# Patient Record
Sex: Male | Born: 1945 | Race: Black or African American | Hispanic: No | Marital: Single | Smoking: Current every day smoker
Health system: Southern US, Community
[De-identification: ages and names within clinical notes are randomized; demographics above are authoritative.]

## PROBLEM LIST (undated history)

## (undated) DIAGNOSIS — F1410 Cocaine abuse, uncomplicated: Secondary | ICD-10-CM

## (undated) DIAGNOSIS — I10 Essential (primary) hypertension: Secondary | ICD-10-CM

## (undated) DIAGNOSIS — F1010 Alcohol abuse, uncomplicated: Secondary | ICD-10-CM

## (undated) DIAGNOSIS — F0390 Unspecified dementia without behavioral disturbance: Secondary | ICD-10-CM

## (undated) DIAGNOSIS — D332 Benign neoplasm of brain, unspecified: Secondary | ICD-10-CM

## (undated) DIAGNOSIS — B1920 Unspecified viral hepatitis C without hepatic coma: Secondary | ICD-10-CM

---

## 2018-01-21 ENCOUNTER — Inpatient Hospital Stay (HOSPITAL_COMMUNITY)
Admission: EM | Admit: 2018-01-21 | Discharge: 2018-01-29 | DRG: 638 | Disposition: A | Discharge status: Other Acute Inpatient Hospital | Payer: Medicare Other | Attending: Family Medicine | Admitting: Family Medicine

## 2018-01-21 ENCOUNTER — Other Ambulatory Visit: Payer: Self-pay

## 2018-01-21 DIAGNOSIS — D696 Thrombocytopenia, unspecified: Secondary | ICD-10-CM | POA: Diagnosis present

## 2018-01-21 DIAGNOSIS — E854 Organ-limited amyloidosis: Secondary | ICD-10-CM | POA: Diagnosis present

## 2018-01-21 DIAGNOSIS — Z781 Physical restraint status: Secondary | ICD-10-CM

## 2018-01-21 DIAGNOSIS — F17210 Nicotine dependence, cigarettes, uncomplicated: Secondary | ICD-10-CM | POA: Diagnosis present

## 2018-01-21 DIAGNOSIS — R402363 Coma scale, best motor response, obeys commands, at hospital admission: Secondary | ICD-10-CM | POA: Diagnosis present

## 2018-01-21 DIAGNOSIS — E1165 Type 2 diabetes mellitus with hyperglycemia: Secondary | ICD-10-CM | POA: Diagnosis present

## 2018-01-21 DIAGNOSIS — T380X5A Adverse effect of glucocorticoids and synthetic analogues, initial encounter: Secondary | ICD-10-CM | POA: Diagnosis present

## 2018-01-21 DIAGNOSIS — N3000 Acute cystitis without hematuria: Secondary | ICD-10-CM | POA: Diagnosis present

## 2018-01-21 DIAGNOSIS — Z794 Long term (current) use of insulin: Secondary | ICD-10-CM

## 2018-01-21 DIAGNOSIS — N179 Acute kidney failure, unspecified: Secondary | ICD-10-CM | POA: Diagnosis present

## 2018-01-21 DIAGNOSIS — G9340 Encephalopathy, unspecified: Secondary | ICD-10-CM | POA: Diagnosis present

## 2018-01-21 DIAGNOSIS — F0391 Unspecified dementia with behavioral disturbance: Secondary | ICD-10-CM | POA: Diagnosis present

## 2018-01-21 DIAGNOSIS — N289 Disorder of kidney and ureter, unspecified: Secondary | ICD-10-CM

## 2018-01-21 DIAGNOSIS — B961 Klebsiella pneumoniae [K. pneumoniae] as the cause of diseases classified elsewhere: Secondary | ICD-10-CM | POA: Diagnosis present

## 2018-01-21 DIAGNOSIS — R739 Hyperglycemia, unspecified: Secondary | ICD-10-CM | POA: Diagnosis not present

## 2018-01-21 DIAGNOSIS — R451 Restlessness and agitation: Secondary | ICD-10-CM | POA: Diagnosis present

## 2018-01-21 DIAGNOSIS — Z79899 Other long term (current) drug therapy: Secondary | ICD-10-CM

## 2018-01-21 DIAGNOSIS — E785 Hyperlipidemia, unspecified: Secondary | ICD-10-CM | POA: Diagnosis present

## 2018-01-21 DIAGNOSIS — E1100 Type 2 diabetes mellitus with hyperosmolarity without nonketotic hyperglycemic-hyperosmolar coma (NKHHC): Secondary | ICD-10-CM | POA: Diagnosis not present

## 2018-01-21 DIAGNOSIS — R4189 Other symptoms and signs involving cognitive functions and awareness: Secondary | ICD-10-CM | POA: Diagnosis present

## 2018-01-21 DIAGNOSIS — Z7952 Long term (current) use of systemic steroids: Secondary | ICD-10-CM

## 2018-01-21 DIAGNOSIS — F05 Delirium due to known physiological condition: Secondary | ICD-10-CM | POA: Diagnosis present

## 2018-01-21 DIAGNOSIS — E11649 Type 2 diabetes mellitus with hypoglycemia without coma: Secondary | ICD-10-CM | POA: Diagnosis not present

## 2018-01-21 DIAGNOSIS — Z8659 Personal history of other mental and behavioral disorders: Secondary | ICD-10-CM

## 2018-01-21 DIAGNOSIS — D539 Nutritional anemia, unspecified: Secondary | ICD-10-CM | POA: Diagnosis present

## 2018-01-21 DIAGNOSIS — B9620 Unspecified Escherichia coli [E. coli] as the cause of diseases classified elsewhere: Secondary | ICD-10-CM | POA: Diagnosis present

## 2018-01-21 DIAGNOSIS — N390 Urinary tract infection, site not specified: Secondary | ICD-10-CM | POA: Diagnosis present

## 2018-01-21 DIAGNOSIS — E861 Hypovolemia: Secondary | ICD-10-CM | POA: Diagnosis present

## 2018-01-21 DIAGNOSIS — F1011 Alcohol abuse, in remission: Secondary | ICD-10-CM | POA: Diagnosis present

## 2018-01-21 DIAGNOSIS — I776 Arteritis, unspecified: Secondary | ICD-10-CM | POA: Diagnosis present

## 2018-01-21 DIAGNOSIS — I10 Essential (primary) hypertension: Secondary | ICD-10-CM | POA: Diagnosis present

## 2018-01-21 DIAGNOSIS — G40909 Epilepsy, unspecified, not intractable, without status epilepticus: Secondary | ICD-10-CM | POA: Diagnosis present

## 2018-01-21 DIAGNOSIS — I680 Cerebral amyloid angiopathy: Secondary | ICD-10-CM | POA: Diagnosis present

## 2018-01-21 DIAGNOSIS — E875 Hyperkalemia: Secondary | ICD-10-CM | POA: Diagnosis present

## 2018-01-21 DIAGNOSIS — R402243 Coma scale, best verbal response, confused conversation, at hospital admission: Secondary | ICD-10-CM | POA: Diagnosis present

## 2018-01-21 DIAGNOSIS — R402143 Coma scale, eyes open, spontaneous, at hospital admission: Secondary | ICD-10-CM | POA: Diagnosis present

## 2018-01-21 DIAGNOSIS — E119 Type 2 diabetes mellitus without complications: Secondary | ICD-10-CM

## 2018-01-21 DIAGNOSIS — T383X6A Underdosing of insulin and oral hypoglycemic [antidiabetic] drugs, initial encounter: Secondary | ICD-10-CM | POA: Diagnosis present

## 2018-01-21 HISTORY — DX: Essential (primary) hypertension: I10

## 2018-01-21 LAB — CBG MONITORING, ED: Glucose-Capillary: >600 mg/dL (ref 70–99)

## 2018-01-21 MED ORDER — INSULIN ASPART 100 UNIT/ML IV SOLN
10.0000 [IU] | Freq: Once | INTRAVENOUS | Status: AC
  Administered 2018-01-22: 10 [IU] via INTRAVENOUS
  Filled 2018-01-21: qty 0.1

## 2018-01-21 MED ORDER — SODIUM CHLORIDE 0.9 % IV BOLUS
1000.0000 mL | Freq: Once | INTRAVENOUS | Status: AC
  Administered 2018-01-22: 1000 mL via INTRAVENOUS

## 2018-01-21 NOTE — ED Triage Notes (Signed)
Pt BIB EMS from home. CBG per EMS was 497. Pt moved to [facility] from SNF in [place] about a week ago. 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 Per EMS pt family states he is ambulatory at baseline and often combative, restless at night. EMS called out tonight for incr'd AMS, tremors. CBG high for FD & EMS. Unsure what pt's baseline is.

## 2018-01-21 NOTE — ED Provider Notes (Signed)
[facility] EMERGENCY DEPARTMENT Provider Note   CSN: [id number] Arrival date & time: 01/21/18  2253     History   Chief Complaint Chief Complaint  Patient presents with  . Hyperglycemia  . Altered Mental Status    HPI Omarri Eich is a 72 y.o. male.  HPI  This is a 72 year old male with a complicated past medical history who is new to our system who presents with tremulousness some sundowning.  Reported history of hypertension, hyperlipidemia, polysubstance abuse, extensive alcohol abuse, recent prolonged hospital admission in [place] for amyloid beta related angiitis.  Daughter is at bedside and provides history.  Patient is noncontributory to history taking.  Daughter reports that he was in a skilled nursing facility in [place] but was unhappy with his care.  He called her.  She moved him down here 1 week ago.  She reports that at baseline he talks during the day but tends to sundown at night and becomes more agitated.  She gives him Zyprexa 3 times daily which seems to help some.  She has noted over the last 24 hours he has been more tremulous and has had difficulty walking on his own.  She does state that she did works 2 jobs and is relying on her children to help her medicate him.  He did not get his insulin today and was noted to have high blood sugars.  She states that he has not complained of anything.  No noted fevers at home.  Patient is noncontributory to history taking.  Outside paperwork reviewed.  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  He is currently on 30 mg of prednisone daily this is to be tapered by neurology and rheumatology at [facility].  Past Medical History:  Diagnosis Date  . Hypertension     Patient Active Problem List   Diagnosis Date Noted  . Renal insufficiency 01/22/2018  . Hyperkalemia 01/22/2018  . Acute lower UTI 01/22/2018  . Insulin-requiring or dependent type II diabetes mellitus ([place])  01/22/2018  . Uncontrolled type 2 DM with hyperosmolar nonketotic hyperglycemia ([place]) 01/22/2018  . Cerebral amyloid angiopathy ([place]) 01/22/2018  . Restlessness and agitation 01/22/2018  . Essential hypertension 01/22/2018  . Acute cystitis without hematuria     History reviewed. No pertinent surgical history.      Home Medications    Prior to Admission medications   Medication Sig Start Date End Date Taking? Authorizing Provider  acetaminophen (TYLENOL) 325 MG tablet Take 650 mg by mouth every 4 (four) hours as needed for mild pain.   Yes [provider]  atorvastatin (LIPITOR) 20 MG tablet Take 20 mg by mouth at bedtime.   Yes [provider]  atovaquone (MEPRON) 750 MG/5ML suspension Take 1,500 mg by mouth daily.   Yes [provider]  calcium carbonate (OS-CAL - DOSED IN MG OF ELEMENTAL CALCIUM) 1250 (500 Ca) MG tablet Take 1 tablet by mouth daily with breakfast.   Yes [provider]  carvedilol (COREG) 12.5 MG tablet Take 12.5 mg by mouth 2 (two) times daily with a meal.   Yes [provider]  cholecalciferol (VITAMIN D) 1000 units tablet Take 1,000 Units by mouth every other day.   Yes [provider]  cloNIDine (CATAPRES) 0.1 MG tablet Take 0.1 mg by mouth 2 (two) times daily.   Yes [provider]  divalproex (DEPAKOTE) 250 MG DR tablet Take 750 mg by mouth 2 (two) times daily.  Yes [provider]  famotidine (PEPCID) 20 MG tablet Take 20 mg by mouth 2 (two) times daily.   Yes [provider]  folic acid (FOLVITE) 1 MG tablet Take 1 mg by mouth daily.   Yes [provider]  Lacosamide 100 MG TABS Take 100 mg by mouth 2 (two) times daily.   Yes [provider]  Melatonin 5 MG TABS Take 5 mg by mouth at bedtime.   Yes [provider]  OLANZapine (ZYPREXA) 5 MG tablet Take 5 mg by mouth every 6 (six) hours as needed (for agitation).   Yes [provider]    predniSONE (DELTASONE) 10 MG tablet Take 10 mg by mouth 3 (three) times daily.   Yes [provider]  Sennosides (SENOKOT XTRA) 17.2 MG TABS Take 17.2 mg by mouth at bedtime.   Yes [provider]    Family History Family History  Problem Relation Age of Onset  . Obesity Other     Social History Social History   Tobacco Use  . Smoking status: Unknown If Ever Smoked  . Smokeless tobacco: Never Used  Substance Use Topics  . Alcohol use: Not Currently    Comment: former alcoholic   . Drug use: Not Currently     Allergies   Patient has no known allergies.   Review of Systems Review of Systems  Unable to perform ROS: Dementia     Physical Exam Updated Vital Signs BP 110/70   Pulse 77   Temp 98.8 F (37.1 C) (Rectal)   Resp (!) 24   Ht 5\' 7"  (1.702 m)   Wt 86.6 kg   SpO2 100%   BMI 29.91 kg/m   Physical Exam  Constitutional: He is oriented to person, place, and time.  ABCs intact, nontoxic-appearing, unable to assess orientation questions, and appropriately responds to questions  HENT:  Head: Normocephalic and atraumatic.  Eyes: Pupils are equal, round, and reactive to light.  Pupils 4 mm and reactive bilaterally  Neck: Neck supple.  Cardiovascular: Normal rate, regular rhythm and normal heart sounds.  No murmur heard. Pulmonary/Chest: Effort normal and breath sounds normal. No respiratory distress. He has no wheezes.  Abdominal: Soft. Bowel sounds are normal. There is no tenderness.  Musculoskeletal: He exhibits no edema.  Lymphadenopathy:    He has no cervical adenopathy.  Neurological: He is alert and oriented to person, place, and time.  Patient is alert, minimally verbal and unable to assess orientation, appears to move all 4 extremities spontaneously, does not appear to comprehend commands, slight resting tremor noted  Skin: Skin is warm and dry.  Psychiatric: He has a normal mood and affect.  Nursing note and vitals  reviewed.    ED Treatments / Results  Labs (all labs ordered are listed, but only abnormal results are displayed) Labs Reviewed  CBC WITH DIFFERENTIAL/PLATELET - Abnormal; Notable for the following components:      Result Value   RBC 3.57 (*)    Hemoglobin 12.2 (*)    HCT 38.1 (*)    MCV 106.7 (*)    MCH 34.2 (*)    Platelets 131 (*)    All other components within normal limits  BASIC METABOLIC PANEL - Abnormal; Notable for the following components:   Sodium 133 (*)    Potassium 6.1 (*)    Glucose, Bld 731 (*)    BUN 42 (*)    Creatinine, Ser 1.85 (*)    Calcium 8.8 (*)  GFR calc non Af Amer 36 (*)    GFR calc Af Amer 41 (*)    All other components within normal limits  URINALYSIS, ROUTINE W REFLEX MICROSCOPIC - Abnormal; Notable for the following components:   Glucose, UA >=500 (*)    Ketones, ur 20 (*)    Nitrite POSITIVE (*)    Bacteria, UA MANY (*)    All other components within normal limits  CBG MONITORING, ED - Abnormal; Notable for the following components:   Glucose-Capillary >600 (*)    All other components within normal limits  CBG MONITORING, ED - Abnormal; Notable for the following components:   Glucose-Capillary 410 (*)    All other components within normal limits  URINE CULTURE  VALPROIC ACID LEVEL  HIV ANTIBODY (ROUTINE TESTING)  BASIC METABOLIC PANEL  BASIC METABOLIC PANEL  BASIC METABOLIC PANEL  BASIC METABOLIC PANEL    EKG EKG Interpretation  Date/Time:  Tuesday January 22 2018 01:35:31 EDT Ventricular Rate:  106 PR Interval:    QRS Duration: 95 QT Interval:  337 QTC Calculation: 439 R Axis:   -32 Text Interpretation:  Atrial fibrillation Ventricular premature complex Left axis deviation Low voltage, precordial leads Consider anterior infarct Poor tracing repeat requested Confirmed by Thayer Jew [phone]) on 01/22/2018 3:28:56 AM   Radiology No results found.  Procedures Procedures (including critical care time)  CRITICAL  CARE Performed by: Merryl Hacker   Total critical care time: 45 minutes  Critical care time was exclusive of separately billable procedures and treating other patients.  Critical care was necessary to treat or prevent imminent or life-threatening deterioration.  Critical care was time spent personally by me on the following activities: development of treatment plan with patient and/or surrogate as well as nursing, discussions with consultants, evaluation of patient's response to treatment, examination of patient, obtaining history from patient or surrogate, ordering and performing treatments and interventions, ordering and review of laboratory studies, ordering and review of radiographic studies, pulse oximetry and re-evaluation of patient's condition.   Medications Ordered in ED Medications  OLANZapine (ZYPREXA) tablet 5 mg (5 mg Oral Given 01/22/18 0212)  insulin regular (NOVOLIN R,HUMULIN R) 100 Units in sodium chloride 0.9 % 100 mL (1 Units/mL) infusion ( Intravenous Not Given 01/22/18 0308)  atovaquone (MEPRON) 750 MG/5ML suspension 1,500 mg (has no administration in time range)  atorvastatin (LIPITOR) tablet 20 mg (has no administration in time range)  carvedilol (COREG) tablet 12.5 mg (has no administration in time range)  cloNIDine (CATAPRES) tablet 0.1 mg (has no administration in time range)  OLANZapine (ZYPREXA) tablet 5 mg (has no administration in time range)  predniSONE (DELTASONE) tablet 10 mg (has no administration in time range)  famotidine (PEPCID) tablet 20 mg (has no administration in time range)  senna (SENOKOT) tablet 17.2 mg (has no administration in time range)  folic acid (FOLVITE) tablet 1 mg (has no administration in time range)  Melatonin TABS 6 mg (has no administration in time range)  divalproex (DEPAKOTE) DR tablet 750 mg (has no administration in time range)  lacosamide (VIMPAT) tablet 100 mg (has no administration in time range)  cholecalciferol  (VITAMIN D) tablet 1,000 Units (has no administration in time range)  0.9 %  sodium chloride infusion (has no administration in time range)  dextrose 5 %-0.45 % sodium chloride infusion (has no administration in time range)  cefTRIAXone (ROCEPHIN) 1 g in sodium chloride 0.9 % 100 mL IVPB (has no administration in time range)  valproate (DEPACON) 750  mg in dextrose 5 % 50 mL IVPB (has no administration in time range)  LORazepam (ATIVAN) injection 1 mg (has no administration in time range)  sodium chloride 0.9 % bolus 1,000 mL (0 mLs Intravenous Stopped 01/22/18 0210)  insulin aspart (novoLOG) injection 10 Units (10 Units Intravenous Given 01/22/18 0009)  haloperidol lactate (HALDOL) injection 2 mg (2 mg Intravenous Given 01/22/18 0138)  cefTRIAXone (ROCEPHIN) 1 g in sodium chloride 0.9 % 100 mL IVPB (0 g Intravenous Stopped 01/22/18 0300)  haloperidol lactate (HALDOL) injection 2 mg (2 mg Intravenous Given 01/22/18 0231)     Initial Impression / Assessment and Plan / ED Course  I have reviewed the triage vital signs and the nursing notes.  Pertinent labs & imaging results that were available during my care of the patient were reviewed by me and considered in my medical decision making (see chart for details).  Clinical Course as of Jan 23 328  Tue Jan 22, 2018  0140 Patient agitated, appears to be sundowning.  IV Haldol ordered as well as his nightly Zyprexa.  Daughter reports that he gets very agitated when his Zyprexa wears off and especially at night.   [CH]    Clinical Course User Index [CH] Caylee Vlachos, Barbette Hair, MD    Patient presents with increased altered mental status and tremors per daughter.  Recent complicated medical history including diagnosis of cerebral angitis for which he is taking prednisone.  He is difficult to examine but does not appear focal.  He is hyperglycemic.  No evidence of DKA however his blood sugar is greater than 600.  Patient was given fluids and IV insulin.  He  is on multiple seizure medications as well as medications for agitation.  He did require several doses of medication for sedation for agitation.  He appeared to be sundowning.  He has evidence of acute UTI.  Patient was given IV Rocephin.  He does not appear septic.  He remains hyperglycemic.  He was placed on glucose stabilizer.  I have concerns that this patient is currently living at home with his daughter.  His daughter appears very overwhelmed.  He does have some acute medical issues including UTI as well as hyperglycemia.  We will plan for admission.  At this time no indication for acute neurologic evaluation as symptoms are likely related to known recent diagnosis of cerebral angitis.  Likely will need social work and case management for help with resources and placement.  Final Clinical Impressions(s) / ED Diagnoses   Final diagnoses:  Hyperglycemia  Acute cystitis without hematuria    ED Discharge Orders    None       Merryl Hacker, MD 01/22/18 [phone]

## 2018-01-22 ENCOUNTER — Encounter (HOSPITAL_COMMUNITY): Payer: Self-pay | Admitting: Family Medicine

## 2018-01-22 DIAGNOSIS — R739 Hyperglycemia, unspecified: Secondary | ICD-10-CM | POA: Diagnosis present

## 2018-01-22 DIAGNOSIS — E861 Hypovolemia: Secondary | ICD-10-CM | POA: Diagnosis not present

## 2018-01-22 DIAGNOSIS — B961 Klebsiella pneumoniae [K. pneumoniae] as the cause of diseases classified elsewhere: Secondary | ICD-10-CM | POA: Diagnosis not present

## 2018-01-22 DIAGNOSIS — B9620 Unspecified Escherichia coli [E. coli] as the cause of diseases classified elsewhere: Secondary | ICD-10-CM | POA: Diagnosis not present

## 2018-01-22 DIAGNOSIS — I10 Essential (primary) hypertension: Secondary | ICD-10-CM | POA: Diagnosis not present

## 2018-01-22 DIAGNOSIS — E1165 Type 2 diabetes mellitus with hyperglycemia: Secondary | ICD-10-CM | POA: Diagnosis not present

## 2018-01-22 DIAGNOSIS — E785 Hyperlipidemia, unspecified: Secondary | ICD-10-CM | POA: Diagnosis not present

## 2018-01-22 DIAGNOSIS — E875 Hyperkalemia: Secondary | ICD-10-CM

## 2018-01-22 DIAGNOSIS — I776 Arteritis, unspecified: Secondary | ICD-10-CM | POA: Diagnosis not present

## 2018-01-22 DIAGNOSIS — E1100 Type 2 diabetes mellitus with hyperosmolarity without nonketotic hyperglycemic-hyperosmolar coma (NKHHC): Principal | ICD-10-CM

## 2018-01-22 DIAGNOSIS — N289 Disorder of kidney and ureter, unspecified: Secondary | ICD-10-CM | POA: Diagnosis present

## 2018-01-22 DIAGNOSIS — F05 Delirium due to known physiological condition: Secondary | ICD-10-CM | POA: Diagnosis present

## 2018-01-22 DIAGNOSIS — R4182 Altered mental status, unspecified: Secondary | ICD-10-CM | POA: Diagnosis not present

## 2018-01-22 DIAGNOSIS — N179 Acute kidney failure, unspecified: Secondary | ICD-10-CM | POA: Diagnosis present

## 2018-01-22 DIAGNOSIS — N390 Urinary tract infection, site not specified: Secondary | ICD-10-CM | POA: Diagnosis present

## 2018-01-22 DIAGNOSIS — T380X5A Adverse effect of glucocorticoids and synthetic analogues, initial encounter: Secondary | ICD-10-CM | POA: Diagnosis not present

## 2018-01-22 DIAGNOSIS — I680 Cerebral amyloid angiopathy: Secondary | ICD-10-CM | POA: Diagnosis present

## 2018-01-22 DIAGNOSIS — F0391 Unspecified dementia with behavioral disturbance: Secondary | ICD-10-CM | POA: Diagnosis not present

## 2018-01-22 DIAGNOSIS — D539 Nutritional anemia, unspecified: Secondary | ICD-10-CM | POA: Diagnosis not present

## 2018-01-22 DIAGNOSIS — G40909 Epilepsy, unspecified, not intractable, without status epilepticus: Secondary | ICD-10-CM | POA: Diagnosis not present

## 2018-01-22 DIAGNOSIS — R451 Restlessness and agitation: Secondary | ICD-10-CM | POA: Diagnosis present

## 2018-01-22 DIAGNOSIS — E11649 Type 2 diabetes mellitus with hypoglycemia without coma: Secondary | ICD-10-CM | POA: Diagnosis not present

## 2018-01-22 DIAGNOSIS — N3000 Acute cystitis without hematuria: Secondary | ICD-10-CM | POA: Diagnosis present

## 2018-01-22 DIAGNOSIS — E854 Organ-limited amyloidosis: Secondary | ICD-10-CM

## 2018-01-22 DIAGNOSIS — Z794 Long term (current) use of insulin: Secondary | ICD-10-CM

## 2018-01-22 DIAGNOSIS — G9340 Encephalopathy, unspecified: Secondary | ICD-10-CM | POA: Diagnosis present

## 2018-01-22 DIAGNOSIS — E119 Type 2 diabetes mellitus without complications: Secondary | ICD-10-CM

## 2018-01-22 DIAGNOSIS — D696 Thrombocytopenia, unspecified: Secondary | ICD-10-CM | POA: Diagnosis present

## 2018-01-22 DIAGNOSIS — F17210 Nicotine dependence, cigarettes, uncomplicated: Secondary | ICD-10-CM | POA: Diagnosis present

## 2018-01-22 DIAGNOSIS — Z781 Physical restraint status: Secondary | ICD-10-CM | POA: Diagnosis not present

## 2018-01-22 LAB — BASIC METABOLIC PANEL
Anion gap: 11 (ref 5–15)
Anion gap: 11 (ref 5–15)
Anion gap: 12 (ref 5–15)
Anion gap: 15 (ref 5–15)
BUN: 33 mg/dL — AB (ref 8–23)
BUN: 33 mg/dL — AB (ref 8–23)
BUN: 39 mg/dL — ABNORMAL HIGH (ref 8–23)
BUN: 42 mg/dL — ABNORMAL HIGH (ref 8–23)
CALCIUM: 8.6 mg/dL — AB (ref 8.9–10.3)
CALCIUM: 9 mg/dL (ref 8.9–10.3)
CALCIUM: 8.9 mg/dL (ref 8.9–10.3)
CHLORIDE: 98 mmol/L (ref 98–111)
CO2: 23 mmol/L (ref 22–32)
CO2: 24 mmol/L (ref 22–32)
CO2: 26 mmol/L (ref 22–32)
CO2: 28 mmol/L (ref 22–32)
CREATININE: 1.67 mg/dL — AB (ref 0.61–1.24)
Calcium: 8.8 mg/dL — ABNORMAL LOW (ref 8.9–10.3)
Chloride: 103 mmol/L (ref 98–111)
Chloride: 111 mmol/L (ref 98–111)
Chloride: 108 mmol/L (ref 98–111)
Creatinine, Ser: 1.51 mg/dL — ABNORMAL HIGH (ref 0.61–1.24)
Creatinine, Ser: 1.53 mg/dL — ABNORMAL HIGH (ref 0.61–1.24)
Creatinine, Ser: 1.85 mg/dL — ABNORMAL HIGH (ref 0.61–1.24)
GFR calc Af Amer: 41 mL/min — ABNORMAL LOW (ref >60)
GFR calc Af Amer: 47 mL/min — ABNORMAL LOW (ref >60)
GFR calc Af Amer: 52 mL/min — ABNORMAL LOW (ref >60)
GFR calc Af Amer: 53 mL/min — ABNORMAL LOW (ref >60)
GFR calc non Af Amer: 36 mL/min — ABNORMAL LOW (ref >60)
GFR, EST NON AFRICAN AMERICAN: 40 mL/min — AB (ref >60)
GFR, EST NON AFRICAN AMERICAN: 45 mL/min — AB (ref >60)
GFR, EST NON AFRICAN AMERICAN: 45 mL/min — AB (ref >60)
GLUCOSE: 432 mg/dL — AB (ref 70–99)
GLUCOSE: 60 mg/dL — AB (ref 70–99)
GLUCOSE: 89 mg/dL (ref 70–99)
GLUCOSE: 731 mg/dL — AB (ref 70–99)
POTASSIUM: 6.1 mmol/L — AB (ref 3.5–5.1)
Potassium: 4.5 mmol/L (ref 3.5–5.1)
Potassium: 3.6 mmol/L (ref 3.5–5.1)
Potassium: 4.1 mmol/L (ref 3.5–5.1)
Sodium: 133 mmol/L — ABNORMAL LOW (ref 135–145)
Sodium: 140 mmol/L (ref 135–145)
Sodium: 147 mmol/L — ABNORMAL HIGH (ref 135–145)
Sodium: 150 mmol/L — ABNORMAL HIGH (ref 135–145)

## 2018-01-22 LAB — CBC WITH DIFFERENTIAL/PLATELET
ABS IMMATURE GRANULOCYTES: 0.1 10*3/uL (ref 0.0–0.1)
Basophils Absolute: 0 10*3/uL (ref 0.0–0.1)
Basophils Relative: 1 %
Eosinophils Absolute: 0 10*3/uL (ref 0.0–0.7)
Eosinophils Relative: 0 %
HCT: 38.1 % — ABNORMAL LOW (ref 39.0–52.0)
HEMOGLOBIN: 12.2 g/dL — AB (ref 13.0–17.0)
Immature Granulocytes: 1 %
LYMPHS PCT: 18 %
Lymphs Abs: 1 10*3/uL (ref 0.7–4.0)
MCH: 34.2 pg — ABNORMAL HIGH (ref 26.0–34.0)
MCHC: 32 g/dL (ref 30.0–36.0)
MCV: 106.7 fL — AB (ref 78.0–100.0)
MONO ABS: 0.4 10*3/uL (ref 0.1–1.0)
MONOS PCT: 7 %
Neutro Abs: 4.3 10*3/uL (ref 1.7–7.7)
Neutrophils Relative %: 73 %
Platelets: 131 10*3/uL — ABNORMAL LOW (ref 150–400)
RBC: 3.57 MIL/uL — ABNORMAL LOW (ref 4.22–5.81)
RDW: 12.9 % (ref 11.5–15.5)
WBC: 5.9 10*3/uL (ref 4.0–10.5)

## 2018-01-22 LAB — URINALYSIS, ROUTINE W REFLEX MICROSCOPIC
Bilirubin Urine: NEGATIVE
Glucose, UA: >=500 mg/dL — AB
HGB URINE DIPSTICK: NEGATIVE
KETONES UR: 20 mg/dL — AB
Leukocytes, UA: NEGATIVE
Nitrite: POSITIVE — AB
PROTEIN: NEGATIVE mg/dL
Specific Gravity, Urine: 1.026 (ref 1.005–1.030)
pH: 5 (ref 5.0–8.0)

## 2018-01-22 LAB — GLUCOSE, CAPILLARY
GLUCOSE-CAPILLARY: 365 mg/dL — AB (ref 70–99)
GLUCOSE-CAPILLARY: 328 mg/dL — AB (ref 70–99)
GLUCOSE-CAPILLARY: 327 mg/dL — AB (ref 70–99)
GLUCOSE-CAPILLARY: 155 mg/dL — AB (ref 70–99)
GLUCOSE-CAPILLARY: 147 mg/dL — AB (ref 70–99)
Glucose-Capillary: 380 mg/dL — ABNORMAL HIGH (ref 70–99)
Glucose-Capillary: 413 mg/dL — ABNORMAL HIGH (ref 70–99)
Glucose-Capillary: 254 mg/dL — ABNORMAL HIGH (ref 70–99)
Glucose-Capillary: 144 mg/dL — ABNORMAL HIGH (ref 70–99)
Glucose-Capillary: 50 mg/dL — ABNORMAL LOW (ref 70–99)
Glucose-Capillary: 99 mg/dL (ref 70–99)
Glucose-Capillary: 263 mg/dL — ABNORMAL HIGH (ref 70–99)
Glucose-Capillary: 174 mg/dL — ABNORMAL HIGH (ref 70–99)

## 2018-01-22 LAB — MRSA PCR SCREENING: MRSA by PCR: NEGATIVE

## 2018-01-22 LAB — CBG MONITORING, ED: Glucose-Capillary: 410 mg/dL — ABNORMAL HIGH (ref 70–99)

## 2018-01-22 LAB — VALPROIC ACID LEVEL: VALPROIC ACID LVL: 91 ug/mL (ref 50.0–100.0)

## 2018-01-22 LAB — HEMOGLOBIN A1C
HEMOGLOBIN A1C: 7.9 % — AB (ref 4.8–5.6)
Mean Plasma Glucose: 180.03 mg/dL

## 2018-01-22 LAB — HIV ANTIBODY (ROUTINE TESTING W REFLEX): HIV SCREEN 4TH GENERATION: NONREACTIVE

## 2018-01-22 MED ORDER — PREDNISONE 10 MG PO TABS
10.0000 mg | ORAL_TABLET | Freq: Three times a day (TID) | ORAL | Status: DC
  Administered 2018-01-22 – 2018-01-24 (×6): 10 mg via ORAL
  Filled 2018-01-22 (×7): qty 1

## 2018-01-22 MED ORDER — SODIUM CHLORIDE 0.9 % IV SOLN
INTRAVENOUS | Status: DC
  Administered 2018-01-22: 04:00:00 via INTRAVENOUS

## 2018-01-22 MED ORDER — CLONIDINE HCL 0.1 MG PO TABS
0.1000 mg | ORAL_TABLET | Freq: Two times a day (BID) | ORAL | Status: DC
  Administered 2018-01-22 – 2018-01-29 (×14): 0.1 mg via ORAL
  Filled 2018-01-22 (×15): qty 1

## 2018-01-22 MED ORDER — ATORVASTATIN CALCIUM 20 MG PO TABS
20.0000 mg | ORAL_TABLET | Freq: Every day | ORAL | Status: DC
  Administered 2018-01-22 – 2018-01-28 (×7): 20 mg via ORAL
  Filled 2018-01-22 (×7): qty 1

## 2018-01-22 MED ORDER — DEXTROSE-NACL 5-0.45 % IV SOLN: INTRAVENOUS | Status: DC

## 2018-01-22 MED ORDER — SODIUM CHLORIDE 0.9 % IV SOLN: INTRAVENOUS | Status: DC

## 2018-01-22 MED ORDER — VALPROATE SODIUM 500 MG/5ML IV SOLN
750.0000 mg | Freq: Once | INTRAVENOUS | Status: AC
  Administered 2018-01-22: 750 mg via INTRAVENOUS
  Filled 2018-01-22: qty 7.5

## 2018-01-22 MED ORDER — ATOVAQUONE 750 MG/5ML PO SUSP
1500.0000 mg | Freq: Every day | ORAL | Status: DC
  Administered 2018-01-23 – 2018-01-29 (×7): 1500 mg via ORAL
  Filled 2018-01-22 (×8): qty 10

## 2018-01-22 MED ORDER — DEXTROSE-NACL 5-0.45 % IV SOLN
INTRAVENOUS | Status: DC
  Administered 2018-01-22 – 2018-01-23 (×2): via INTRAVENOUS

## 2018-01-22 MED ORDER — FAMOTIDINE 20 MG PO TABS
20.0000 mg | ORAL_TABLET | Freq: Two times a day (BID) | ORAL | Status: DC
  Administered 2018-01-22 – 2018-01-29 (×15): 20 mg via ORAL
  Filled 2018-01-22 (×16): qty 1

## 2018-01-22 MED ORDER — SODIUM CHLORIDE 0.9 % IV SOLN
1.0000 g | INTRAVENOUS | Status: DC
  Administered 2018-01-23 – 2018-01-24 (×2): 1 g via INTRAVENOUS
  Filled 2018-01-22 (×2): qty 10

## 2018-01-22 MED ORDER — SODIUM CHLORIDE 0.9 % IV SOLN
100.0000 mg | Freq: Two times a day (BID) | INTRAVENOUS | Status: DC
  Administered 2018-01-22 – 2018-01-24 (×5): 100 mg via INTRAVENOUS
  Filled 2018-01-22 (×5): qty 10

## 2018-01-22 MED ORDER — LORAZEPAM 2 MG/ML IJ SOLN
1.0000 mg | Freq: Once | INTRAMUSCULAR | Status: AC
  Administered 2018-01-22: 1 mg via INTRAVENOUS
  Filled 2018-01-22: qty 1

## 2018-01-22 MED ORDER — FOLIC ACID 1 MG PO TABS
1.0000 mg | ORAL_TABLET | Freq: Every day | ORAL | Status: DC
  Administered 2018-01-23 – 2018-01-29 (×7): 1 mg via ORAL
  Filled 2018-01-22 (×8): qty 1

## 2018-01-22 MED ORDER — ENSURE ENLIVE PO LIQD
237.0000 mL | Freq: Two times a day (BID) | ORAL | Status: DC
  Administered 2018-01-23 – 2018-01-29 (×11): 237 mL via ORAL

## 2018-01-22 MED ORDER — SODIUM CHLORIDE 0.9 % IV SOLN
INTRAVENOUS | Status: DC
  Administered 2018-01-22: 3.2 [IU]/h via INTRAVENOUS
  Filled 2018-01-22: qty 1

## 2018-01-22 MED ORDER — SODIUM CHLORIDE 0.9 % IV SOLN
1.0000 g | Freq: Once | INTRAVENOUS | Status: AC
  Administered 2018-01-22: 1 g via INTRAVENOUS
  Filled 2018-01-22: qty 10

## 2018-01-22 MED ORDER — SENNA 8.6 MG PO TABS
17.2000 mg | ORAL_TABLET | Freq: Every day | ORAL | Status: DC
  Administered 2018-01-22 – 2018-01-28 (×7): 17.2 mg via ORAL
  Filled 2018-01-22 (×7): qty 2

## 2018-01-22 MED ORDER — OLANZAPINE 5 MG PO TABS
5.0000 mg | ORAL_TABLET | Freq: Every day | ORAL | Status: DC
  Administered 2018-01-22 – 2018-01-23 (×3): 5 mg via ORAL
  Filled 2018-01-22 (×3): qty 1

## 2018-01-22 MED ORDER — VITAMIN D 1000 UNITS PO TABS
1000.0000 [IU] | ORAL_TABLET | ORAL | Status: DC
  Administered 2018-01-24 – 2018-01-28 (×4): 1000 [IU] via ORAL
  Filled 2018-01-22 (×7): qty 1

## 2018-01-22 MED ORDER — VALPROATE SODIUM 500 MG/5ML IV SOLN
750.0000 mg | Freq: Two times a day (BID) | INTRAVENOUS | Status: DC
  Administered 2018-01-22 – 2018-01-23 (×4): 750 mg via INTRAVENOUS
  Filled 2018-01-22 (×5): qty 7.5

## 2018-01-22 MED ORDER — DIVALPROEX SODIUM 250 MG PO DR TAB
750.0000 mg | DELAYED_RELEASE_TABLET | Freq: Two times a day (BID) | ORAL | Status: DC
  Filled 2018-01-22: qty 3

## 2018-01-22 MED ORDER — LACOSAMIDE 50 MG PO TABS
100.0000 mg | ORAL_TABLET | Freq: Two times a day (BID) | ORAL | Status: DC
  Filled 2018-01-22: qty 2

## 2018-01-22 MED ORDER — DEXTROSE 50 % IV SOLN
INTRAVENOUS | Status: AC
  Administered 2018-01-22: 20 mL
  Filled 2018-01-22: qty 50

## 2018-01-22 MED ORDER — CARVEDILOL 12.5 MG PO TABS
12.5000 mg | ORAL_TABLET | Freq: Two times a day (BID) | ORAL | Status: DC
  Administered 2018-01-23 – 2018-01-29 (×14): 12.5 mg via ORAL
  Filled 2018-01-22 (×15): qty 1

## 2018-01-22 MED ORDER — INSULIN ASPART 100 UNIT/ML ~~LOC~~ SOLN: 0.0000 [IU] | Freq: Every day | SUBCUTANEOUS | Status: DC

## 2018-01-22 MED ORDER — MELATONIN 3 MG PO TABS: 6.0000 mg | ORAL_TABLET | Freq: Every day | ORAL | Status: DC

## 2018-01-22 MED ORDER — HALOPERIDOL LACTATE 5 MG/ML IJ SOLN
2.0000 mg | Freq: Once | INTRAMUSCULAR | Status: AC
  Administered 2018-01-22: 2 mg via INTRAVENOUS
  Filled 2018-01-22: qty 1

## 2018-01-22 MED ORDER — OLANZAPINE 5 MG PO TABS
5.0000 mg | ORAL_TABLET | Freq: Four times a day (QID) | ORAL | Status: DC | PRN
  Administered 2018-01-23 – 2018-01-27 (×4): 5 mg via ORAL
  Filled 2018-01-22 (×6): qty 1

## 2018-01-22 MED ORDER — INSULIN GLARGINE 100 UNIT/ML ~~LOC~~ SOLN
10.0000 [IU] | Freq: Every day | SUBCUTANEOUS | Status: DC
  Administered 2018-01-22 – 2018-01-23 (×2): 10 [IU] via SUBCUTANEOUS
  Filled 2018-01-22 (×2): qty 0.1

## 2018-01-22 MED ORDER — INSULIN ASPART 100 UNIT/ML ~~LOC~~ SOLN
0.0000 [IU] | Freq: Three times a day (TID) | SUBCUTANEOUS | Status: DC
  Administered 2018-01-22: 5 [IU] via SUBCUTANEOUS
  Administered 2018-01-23: 7 [IU] via SUBCUTANEOUS
  Administered 2018-01-23: 5 [IU] via SUBCUTANEOUS

## 2018-01-22 NOTE — Progress Notes (Signed)
Insulin gtt stopped per order at this time. Lantus given prior per order, see MAR. Current CBG 99.

## 2018-01-22 NOTE — Progress Notes (Signed)
Inpatient Diabetes Program Recommendations  AACE/ADA: New Consensus Statement on Inpatient Glycemic Control (2015)  Target Ranges:  Prepandial:   less than 140 mg/dL      Peak postprandial:   less than 180 mg/dL (1-2 hours)      Critically ill patients:  140 - 180 mg/dL   Spoke with Daughter on the phone at home.  She reports patient takes Humalog 2 units with each meal at home.  She had not been checking glucose due to the meter being $90, not including strips. She reports having difficulty remembering to give insulin period. She reported she didn't think about it until EMS reported his glucose being high.  Daughter reports struggling to care for her Father especially at night when he becomes very combative and sometimes breaks things. She feels terrible that she feels she will not be able to care for him but will need assistance in finding a SNF for him from Social Work.  Spoke with Daughter about [employer] Meter $15 and Strips $9 for 50 ct.  Spoke with Dr. Reesa Chew about patient plan of care. Patient may benefit from [facility] instead of Humalog at time of d/c. Will get CM to check patient's copay for Tradjenta 5 mg Daily.  Thanks,  Tama Headings RN, MSN, BC-ADM Inpatient Diabetes Coordinator Team Pager [phone] (8a-5p)

## 2018-01-22 NOTE — Progress Notes (Signed)
Per DKA protocol patient to be switched to D51/2 NS since CBG less than 250. MD stated to discontinue all fluids while at bedside due to patient's renal function and to not administer the D51/2 NS at this time. RN to give lantus and transition off of insulin gtt.

## 2018-01-22 NOTE — Progress Notes (Signed)
BMP results paged to provider

## 2018-01-22 NOTE — Progress Notes (Signed)
Patient seen and examined by me this morning.  Patient was admitted by Dr Myna Hidalgo for treatment of hyperglycemia, hyperkalemia, urinary tract infection and renal insufficiency.  Due to some agitation patient was given 1 mg of IV Ativan last night.  This morning during my evaluation patient is quite drowsy and unable to stay up for any meaningful conversation.  His vital signs show soft blood pressure.  He still is currently not IV insulin.  - At this time we will transition him from insulin drip to subcutaneous insulin.  10 units of subcutaneous Lantus ordered along with sliding scale.  Hemoglobin A1c ordered.  Diabetic coordinator ordered.  We will trend the drip off in about 1 and half hour from given subcutaneous insulin. - Continue IV fluids as needed, monitor urine output.  Continue IV Rocephin- appears that urine culture was sent after patient received IV Rocephin. - Closely monitor renal function, avoid nephrotoxic drugs. - Continue to perform neurochecks.   Eventually will need PT/OT.  Appears that patient would benefit from skilled nursing facility.   Please call with further questions as necessary.  Gerlean Ren MD until 7pm using [email or web address]

## 2018-01-22 NOTE — Progress Notes (Signed)
Pt triggering for SIRS/Sepsis at this time. Provider notified and will proceed with ordering antibiotics at this time.

## 2018-01-22 NOTE — Progress Notes (Signed)
Daughter of Patient called and expressed concern over patient having an initial visit with a vascular Neurologist tomorrow in preparation for a radiation treatment scheduled for 9/7 at [facility]. Dr. Burr Medico [phone]. Daughter also reported the doctor has an Copy.  Daughter is concerned about trying to make an appointment herself as per her "it had to be a MD to MD call to even get this appointment." Daughter says patient can't wait to have an appointment as he has to see the doctor before his September 7th radiation therapy.  Discussed with Dr. Reesa Chew, he will see if CM or unit Secretary can reschedule, but overall family needs to try to call to reschedule appointment.  Thanks,  Tama Headings RN, MSN, BC-ADM Inpatient Diabetes Coordinator Team Pager [phone] (8a-5p)

## 2018-01-22 NOTE — Progress Notes (Signed)
NCM spoke with [facility] [phone]), Dr.Feng's nurse , regarding appointment scheduled for tomorrow. Webb Silversmith stated she will call daughter to establish appointment change . Whitman Hero RN,BSN,CM

## 2018-01-22 NOTE — Progress Notes (Signed)
   01/22/18 0337  Vitals  Temp 97.7 F (36.5 C)  Temp Source Oral  BP 131/90  MAP (mmHg) 96  BP Location Left Arm  BP Method Automatic  Patient Position (if appropriate) Lying  Pulse Rate 78  Pulse Rate Source Monitor  ECG Heart Rate 84  Cardiac Rhythm NSR  Resp 17  Oxygen Therapy  SpO2 100 %  O2 Device Room Air  Pain Assessment  Pain Scale 0-10  Pain Score 0  Assumed care of pt to Rm 07. Pt with Cerebral amyloid angiopathy and unable to assess his cognition at this time. Pt  Was mildly agitated on arrival at the unit requiring safety sitter. Pt with scaly feet  with old marks to lower back otherwise intact skin. Skin assessed with Sheryn Bison RN

## 2018-01-22 NOTE — Progress Notes (Signed)
Initial Nutrition Assessment  DOCUMENTATION CODES:   Not applicable  INTERVENTION:   Offer Ensure Enlive po BID, each supplement provides 350 kcal and 20 grams of protein  Encourage PO intake when alert   NUTRITION DIAGNOSIS:   Inadequate oral intake related to lethargy/confusion as evidenced by meal completion < 25%.  GOAL:   Patient will meet greater than or equal to 90% of their needs  MONITOR:   PO intake, Supplement acceptance  REASON FOR ASSESSMENT:   Malnutrition Screening Tool    ASSESSMENT:   Pt with PMH of alcohol abuse in remission, Sz d/o, HTN, DM, recent prolonged hospitalization for confusion (possible amyloid beta-related angiitis on steroids PTA) in [place], pt was at [facility] in [place] but recently moved to daughter's home PTA. Pt now admitted with DKA and UTI.    No family present at this time. Spoke with Retail banker. Pt was given ativan at 4 am and has been lethargic since. RD was able to complete NFPE. Noted findings of mild fat and muscle depletion however mobility status unknown PTA with prolonged hospitalization.  Pt unable to answer questions during visit. No weight/nutriiton hx on file. Pt has not been awake for meals today. Pt at low threshold for malnutrition.  Medications reviewed and include: vitamin D, folic acid, SSI with meals and bedtime, lantus 10 units daily, prednisone 10 mg TID, senna IVF: D5 1/2 NS @ 75 ml/hr to d/c 8/28 Labs reviewed: Na 147 (H) Lab Results  Component Value Date   HGBA1C 7.9 (H) 01/22/2018      NUTRITION - FOCUSED PHYSICAL EXAM:    Most Recent Value  Orbital Region  No depletion  Upper Arm Region  Mild depletion  Thoracic and Lumbar Region  Mild depletion  Buccal Region  No depletion  Temple Region  Mild depletion  Clavicle Bone Region  No depletion  Clavicle and Acromion Bone Region  Mild depletion  Scapular Bone Region  No depletion  Dorsal Hand  No depletion  Patellar Region  Mild depletion  Anterior Thigh Region   Mild depletion  Posterior Calf Region  Mild depletion  Edema (RD Assessment)  None  Hair  Reviewed  Eyes  Unable to assess  Mouth  Unable to assess  Skin  Reviewed  Nails  Reviewed       Diet Order:   Diet Order            Diet Carb Modified Fluid consistency: Thin; Room service appropriate? Yes  Diet effective now              EDUCATION NEEDS:   No education needs have been identified at this time  Skin:  Skin Assessment: Reviewed RN Assessment  Last BM:  unknown  Height:   Ht Readings from Last 1 Encounters:  01/21/18 5\' 7"  (1.702 m)    Weight:   Wt Readings from Last 1 Encounters:  01/21/18 86.6 kg    Ideal Body Weight:  67.2 kg  BMI:  Body mass index is 29.91 kg/m.  Estimated Nutritional Needs:   Kcal:  1840-2000  Protein:     Fluid:     Maylon Peppers RD, [place], [place] Pager [phone] After Hours Pager

## 2018-01-22 NOTE — H&P (Signed)
History and Physical    Jasim Harari STM:[id number] DOB: [birth year] DOA: 01/21/2018  PCP: Patient, No Pcp Per   Patient coming from: Home   Chief Complaint: Increased confusion and agitation, hyperglycemia   HPI: Notnamed Scholz is a 72 y.o. 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  Patient was upset with his care at an SNF in [place] and moved in with his daughter locally one week ago. He has been confused, particularly at night when he also becomes agitated. This has been worse for the past 2 nights, EMS was called, and he was noted to have marked hyperglycemia.   He has been referred to a vascular neurologist at [facility], started on steroids, and is planned to start treatment with a chemotherapeutic next month per family report.  Daughter and granddaughter have been managing his medications and they forgot his insulin today.  ED Course: Upon arrival to the ED, patient is found to be afebrile, saturating well on room air, with vitals otherwise stable. EKG features significant artifact with indeterminate rhythm, possibly atrial fibrillation, and will be repeated. Chemistry panel reveals a glucose of 731, potassium 6.1, and creatinine 1.85 with unknown baseline. CBC features a mild macrocytic anemia and thrombocytopenia. Urinalysis is nitrite positive. Patient was given a liter of normal saline, 10 units IV NovoLog, Zyprexa, Haldol, and Rocephin in the ED. Insulin infusion was started. He remains hemodynamically stable and will be admitted for ongoing evaluation and management of HHS, UTI, and increased confusion and agitation.  Review of  Systems:  All other systems reviewed and apart from HPI, are negative.  Past Medical History:  Diagnosis Date  . Hypertension     History reviewed. No pertinent surgical history.   has an unknown smoking status. He has never used smokeless tobacco. He reports that he drank alcohol. He reports that he has current or past drug history.  No Known Allergies  Family History  Problem Relation Age of Onset  . Obesity Other      Prior to Admission medications   Medication Sig Start Date End Date Taking? Authorizing Provider  acetaminophen (TYLENOL) 325 MG tablet Take 650 mg by mouth every 4 (four) hours as needed for mild pain.   Yes [provider]  atorvastatin (LIPITOR) 20 MG tablet Take 20 mg by mouth at bedtime.   Yes [provider]  atovaquone (MEPRON) 750 MG/5ML suspension Take 1,500 mg by mouth daily.   Yes [provider]  calcium carbonate (OS-CAL - DOSED IN MG OF ELEMENTAL CALCIUM) 1250 (500 Ca) MG tablet Take 1 tablet by mouth daily with breakfast.   Yes [provider]  carvedilol (COREG) 12.5 MG tablet Take 12.5 mg by mouth 2 (two) times daily with a meal.   Yes [provider]  cholecalciferol (VITAMIN D) 1000 units tablet Take 1,000 Units by mouth every other day.   Yes [provider]  cloNIDine (CATAPRES) 0.1 MG tablet Take 0.1 mg by mouth 2 (two) times daily.   Yes [provider]  divalproex (DEPAKOTE) 250 MG DR tablet Take 750 mg by mouth 2 (two) times daily.   Yes [provider]  famotidine (PEPCID) 20 MG tablet Take 20 mg by mouth 2 (two)  times daily.   Yes [provider]  folic acid (FOLVITE) 1 MG tablet Take 1 mg by mouth daily.   Yes [provider]  Lacosamide 100 MG TABS Take 100 mg by mouth 2 (two) times daily.   Yes [provider]  Melatonin 5 MG TABS Take 5 mg by mouth at bedtime.   Yes [provider]  OLANZapine (ZYPREXA) 5 MG tablet Take 5 mg by  mouth every 6 (six) hours as needed (for agitation).   Yes [provider]  predniSONE (DELTASONE) 10 MG tablet Take 10 mg by mouth 3 (three) times daily.   Yes [provider]  Sennosides (SENOKOT XTRA) 17.2 MG TABS Take 17.2 mg by mouth at bedtime.   Yes [provider]    Physical Exam: Vitals:   01/22/18 0145 01/22/18 0200 01/22/18 0215 01/22/18 0230  BP: 124/80 110/70    Pulse: 85 79 92 77  Resp:  (!) 21 (!) 24   Temp:      TempSrc:      SpO2: 99% 98% 100% 100%  Weight:      Height:          Constitutional: NAD, agitated, disoriented Eyes: PERTLA, lids and conjunctivae normal ENMT: Mucous membranes are moist. Posterior pharynx clear of any exudate or lesions.   Neck: normal, supple, no masses, no thyromegaly Respiratory: Tachypnea. clear to auscultation bilaterally, no wheezing, no crackles. Normal respiratory effort.   Cardiovascular: S1 & S2 heard, regular rate and rhythm. No extremity edema.   Abdomen: No distension, no tenderness, soft. Bowel sounds normal.  Musculoskeletal: no clubbing / cyanosis. No joint deformity upper and lower extremities.   Skin: no significant rashes, lesions, ulcers. Warm, dry, well-perfused. Neurologic: No facial asymmetry. Sensation intact. Moving all extremities.  Psychiatric: Alert. Disoriented and agitated.     Labs on Admission: I have personally reviewed following labs and imaging studies  CBC: Recent Labs  Lab 01/21/18 2346  WBC 5.9  NEUTROABS 4.3  HGB 12.2*  HCT 38.1*  MCV 106.7*  PLT 102*   Basic Metabolic Panel: Recent Labs  Lab 01/21/18 2346  NA 133*  K 6.1*  CL 98  CO2 23  GLUCOSE 731*  BUN 42*  CREATININE 1.85*  CALCIUM 8.8*   GFR: Estimated Creatinine Clearance: 39.6 mL/min (A) (by C-G formula based on SCr of 1.85 mg/dL (H)). Liver Function Tests: No results for input(s): AST, ALT, ALKPHOS, BILITOT, PROT, ALBUMIN in the last 168 hours. No results for input(s): LIPASE, AMYLASE in  the last 168 hours. No results for input(s): AMMONIA in the last 168 hours. Coagulation Profile: No results for input(s): INR, PROTIME in the last 168 hours. Cardiac Enzymes: No results for input(s): CKTOTAL, CKMB, CKMBINDEX, TROPONINI in the last 168 hours. BNP (last 3 results) No results for input(s): PROBNP in the last 8760 hours. HbA1C: No results for input(s): HGBA1C in the last 72 hours. CBG: Recent Labs  Lab 01/21/18 2337 01/22/18 0141  GLUCAP >600* 410*   Lipid Profile: No results for input(s): CHOL, HDL, LDLCALC, TRIG, CHOLHDL, LDLDIRECT in the last 72 hours. Thyroid Function Tests: No results for input(s): TSH, T4TOTAL, FREET4, T3FREE, THYROIDAB in the last 72 hours. Anemia Panel: No results for input(s): VITAMINB12, FOLATE, FERRITIN, TIBC, IRON, RETICCTPCT in the last 72 hours. Urine analysis:    Component Value Date/Time   COLORURINE YELLOW 01/22/2018 0105   APPEARANCEUR CLEAR 01/22/2018 0105   LABSPEC 1.026 01/22/2018 0105   PHURINE 5.0 01/22/2018 0105  GLUCOSEU >=500 (A) 01/22/2018 0105   HGBUR NEGATIVE 01/22/2018 0105   BILIRUBINUR NEGATIVE 01/22/2018 0105   KETONESUR 20 (A) 01/22/2018 0105   PROTEINUR NEGATIVE 01/22/2018 0105   NITRITE POSITIVE (A) 01/22/2018 0105   LEUKOCYTESUR NEGATIVE 01/22/2018 0105   Sepsis Labs: @LABRCNTIP (procalcitonin:4,lacticidven:4) )No results found for this or any previous visit (from the past 240 hour(s)).   Radiological Exams on Admission: No results found.  EKG: Independently reviewed. Significant artifact with undetermined rhythm, possibly a fib, will repeat.   Assessment/Plan   1. Hyperosmolar hyperglycemic state; insulin-dependent DM  - Presents with increased confusion, agitation, and hyperglycemia  - Found to have serum glucose 731 without acidosis  - Likely precipitated by missed insulin doses at home and UTI  - Treated in ED with 1 liter NS, 10 units IV Novolog, and then started on insulin infusion  -  Continue IVF hydration, continue insulin infusion with frequent CBG's and serial chem panels   2. Hyperkalemia  - Serum potassium is elevated to 6.1 in ED in setting of HHS and renal insuficiency  - Anticipate improvement with IVF and insulin  - Continue cardiac monitoring, continue IVF hydration and insulin infusion, follow serial chem panels   3. Renal insufficiency  - SCr is 1.85 on admission with unknown baseline  - He appears hypovolemic in setting of HHS, was given 1 liter NS in ED and will be continued on IVF hydration  - Renally-dose medications, following serial chem panels as above    4. UTI  - UA suggestive of UTI, culture ordered and empiric Rocephin given in ED   - Continue Rocephin, follow culture    5. Cerebral amyloid angiopathy - Patient reportedly had brain biopsy during recent hospitalization in [place] and was diagnosed with cerebral amyloid angiitis  - He is being managed with prednisone and was referred to vascular neurologist at [facility] (Dr [name], [phone]) with plans to start treatment with chemotherapeutic next month per family  - Continue prednisone, continue antipsychotics    6. Hypertension  - BP at goal  - Continue Coreg and clonidine as tolerated    7. Seizure disorder  - Family reports that seizures were in setting of alcohol withdrawal - He is managed with Depakote and lacosamide, will continue    DVT prophylaxis: SCD's  Code Status: Full   Family Communication: Daughter  Consults called: None Admission status: Inpatient     Vianne Bulls, MD [facility] Pager [phone]  If 7PM-7AM, please contact night-coverage [email or web address] Password [email or web address]  01/22/2018, 3:20 AM

## 2018-01-22 NOTE — Care Management Note (Addendum)
Case Management Note  Patient Details  Name: Andrew Burton MRN: [id number] Date of Birth: 1946/04/03  Subjective/Objective:    Admitted with AMS/Hyperglycemia, hyperkalemia, urinary tract infection and renal insufficiency. Hx of  alcohol abuse, seizure disorder, hypertension, seizure disorder, diabetes mellitus, recent  hospital admission for confusion and agitation in [place]. Pt not happy with hospital stay. Moved to [place] to live with daughter a week ago.    Quillian Quince (Daughter) Hassan Buckler (Granddaughter)    [phone]     PCP:  Action/Plan: Per PT's evaluation/recommendation : SNF. Pt agreeable to SNF placement. CSW made aware of disposition need....NCM will continue to monitor.  [facility] [phone] [place] [address]    Next Steps: Go on 02/22/2018    Instructions: 9:50 am             Expected Discharge Date:  01/26/18               Expected Discharge Plan:  [name]  In-House Referral:  Clinical Social Work  Discharge planning Services     Post Acute Care Choice:    Choice offered to:     DME Arranged:    DME Agency:     HH Arranged:    HH Agency:     Status of Service:  In process, will continue to follow  If discussed at Long Length of Stay Meetings, dates discussed:    Additional Comments:  Sharin Mons, RN 01/22/2018, 3:26 PM

## 2018-01-22 NOTE — Progress Notes (Signed)
Inpatient Diabetes Program Recommendations  AACE/ADA: New Consensus Statement on Inpatient Glycemic Control (2015)  Target Ranges:  Prepandial:   less than 140 mg/dL      Peak postprandial:   less than 180 mg/dL (1-2 hours)      Critically ill patients:  140 - 180 mg/dL   Review of Glycemic Control  Diabetes history: DM 2 Outpatient Diabetes medications: None listed in med rec Current orders for Inpatient glycemic control: Lantus 10 units, Novolog 0-9 units tid, Novolog 0-5 units qhs  Inpatient Diabetes Program Recommendations:    Patient with HHS, Glucose 731 on admission, likely due to Urinary Tract infection. Based on A1c 7.9% patient has moderate control of glucose at home. Daughter reported missing one insulin dose when he was admitted. Unsure of what he takes at home as his med rec does not list insulin or DM meds at home.  Also noted patient was on recent steroids as well by a Vascular Neurologist.  Called patient's daughter to verify the types of insulin and doses for patient outpatient and to inquire how often they are checking his glucose. Left Voicemail.  Thanks,  Tama Headings RN, MSN, BC-ADM Inpatient Diabetes Coordinator Team Pager [phone] (8a-5p)

## 2018-01-22 NOTE — Progress Notes (Signed)
Hypoglycemic Event  CBG: 50  Treatment: D50 IV 47ml given per glucose stabilizer.  Symptoms: UTA;  pt drowsy from ativan admin early this am  Follow-up CBG: Time:1116 CBG Result:155  Possible Reasons for Event: Inadequate meal intake     Lance Sell

## 2018-01-22 NOTE — Evaluation (Signed)
Physical Therapy Evaluation Patient Details Name: Andrew Burton MRN: [id number] DOB: [birth year] Today's Date: 01/22/2018   History of Present Illness  Andrew Burton is a 72 y.o. 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  Clinical Impression  Patient presents with decreased mobility due to weakness, decreased arousal, decreased balance and currently medically unstable with CBG of 50.  He evidently was in SNF in [place] and moved to his daughter's home here recently.  Likely will benefit from further SNF level rehab upon d/c.  PT to follow and continue evaluation as patient more stable medically.    Follow Up Recommendations SNF;Supervision/Assistance - 24 hour    Equipment Recommendations  Other (comment)(TBA)    Recommendations for Other Services       Precautions / Restrictions Precautions Precautions: Fall      Mobility  Bed Mobility Overal bed mobility: Needs Assistance Bed Mobility: Supine to Sit;Sit to Supine     Supine to sit: Min assist Sit to supine: Max assist   General bed mobility comments: moving to sit up on EOB, difficulty scooting to EOB so assist, then after CBG checked, assisted LE's and trunk to supine  Transfers                    Ambulation/Gait                Stairs            Wheelchair Mobility    Modified Rankin (Stroke Patients Only)       Balance Overall balance assessment: Needs assistance Sitting-balance support: Feet supported Sitting balance-Leahy Scale: Poor Sitting balance - Comments: tremors and difficulty maintaining upright with low CBG                                      Pertinent Vitals/Pain Pain Assessment: No/denies pain    Home Living Family/patient expects to be discharged to:: Skilled nursing facility                 Additional Comments: was in SNF in [place] per chart and moved here to stay with daughter; pt unable to give history    Prior Function                 Hand Dominance        Extremity/Trunk Assessment   Upper Extremity Assessment Upper Extremity Assessment: Generalized weakness    Lower Extremity Assessment Lower Extremity Assessment: Generalized weakness       Communication      Cognition Arousal/Alertness: Lethargic Behavior During Therapy: Impulsive Overall Cognitive Status: Impaired/Different from baseline                                 General Comments: Patient not responding to questions, moving to sit up in bed, but difficulty maintaining arousal NT checked CBG and was 50      General Comments      Exercises     Assessment/Plan    PT Assessment Patient needs continued PT services  PT Problem List Decreased strength;Decreased mobility;Decreased safety awareness;Decreased balance;Decreased  knowledge of use of DME;Decreased activity tolerance       PT Treatment Interventions DME instruction;Therapeutic activities;Therapeutic exercise;Patient/family education;Gait training;Functional mobility training;Balance training    PT Goals (Current goals can be found in the Care Plan section)  Acute Rehab PT Goals Patient Stated Goal: unable to state PT Goal Formulation: Patient unable to participate in goal setting Time For Goal Achievement: 02/05/18 Potential to Achieve Goals: Fair    Frequency Min 2X/week   Barriers to discharge        Co-evaluation               AM-PAC PT "6 Clicks" Daily Activity  Outcome Measure Difficulty turning over in bed (including adjusting bedclothes, sheets and blankets)?: A Little Difficulty moving from lying on back  to sitting on the side of the bed? : Unable Difficulty sitting down on and standing up from a chair with arms (e.g., wheelchair, bedside commode, etc,.)?: Unable Help needed moving to and from a bed to chair (including a wheelchair)?: Total Help needed walking in hospital room?: Total Help needed climbing 3-5 steps with a railing? : Total 6 Click Score: 8    End of Session   Activity Tolerance: Treatment limited secondary to medical complications (Comment)(CBG 50) Patient left: in bed;with call bell/phone within reach;with nursing/sitter in room;with bed alarm set   PT Visit Diagnosis: Muscle weakness (generalized) (M62.81);Other abnormalities of gait and mobility (R26.89)    Time: 8099-8338 PT Time Calculation (min) (ACUTE ONLY): 14 min   Charges:   PT Evaluation $PT Eval High Complexity: [address]          [place], [place] [phone] 01/22/2018   Reginia Naas 01/22/2018, 1:22 PM

## 2018-01-23 LAB — GLUCOSE, CAPILLARY
GLUCOSE-CAPILLARY: 292 mg/dL — AB (ref 70–99)
GLUCOSE-CAPILLARY: 408 mg/dL — AB (ref 70–99)
Glucose-Capillary: 327 mg/dL — ABNORMAL HIGH (ref 70–99)
Glucose-Capillary: 310 mg/dL — ABNORMAL HIGH (ref 70–99)
Glucose-Capillary: 135 mg/dL — ABNORMAL HIGH (ref 70–99)

## 2018-01-23 LAB — COMPREHENSIVE METABOLIC PANEL
ALBUMIN: 2.9 g/dL — AB (ref 3.5–5.0)
ALT: 12 U/L (ref 0–44)
AST: 12 U/L — ABNORMAL LOW (ref 15–41)
Alkaline Phosphatase: 45 U/L (ref 38–126)
Anion gap: 9 (ref 5–15)
BUN: 26 mg/dL — ABNORMAL HIGH (ref 8–23)
CHLORIDE: 106 mmol/L (ref 98–111)
CO2: 24 mmol/L (ref 22–32)
CREATININE: 1.35 mg/dL — AB (ref 0.61–1.24)
Calcium: 8.2 mg/dL — ABNORMAL LOW (ref 8.9–10.3)
GFR calc non Af Amer: 52 mL/min — ABNORMAL LOW (ref >60)
GLUCOSE: 316 mg/dL — AB (ref 70–99)
Potassium: 5.1 mmol/L (ref 3.5–5.1)
SODIUM: 139 mmol/L (ref 135–145)
Total Bilirubin: 0.7 mg/dL (ref 0.3–1.2)
Total Protein: 5.8 g/dL — ABNORMAL LOW (ref 6.5–8.1)

## 2018-01-23 LAB — CBC
HEMATOCRIT: 37.1 % — AB (ref 39.0–52.0)
Hemoglobin: 11.8 g/dL — ABNORMAL LOW (ref 13.0–17.0)
MCH: 34.3 pg — ABNORMAL HIGH (ref 26.0–34.0)
MCHC: 31.8 g/dL (ref 30.0–36.0)
MCV: 107.8 fL — AB (ref 78.0–100.0)
Platelets: 109 10*3/uL — ABNORMAL LOW (ref 150–400)
RBC: 3.44 MIL/uL — ABNORMAL LOW (ref 4.22–5.81)
RDW: 12.8 % (ref 11.5–15.5)
WBC: 7.7 10*3/uL (ref 4.0–10.5)

## 2018-01-23 LAB — MAGNESIUM: Magnesium: 1.9 mg/dL (ref 1.7–2.4)

## 2018-01-23 MED ORDER — LORAZEPAM 2 MG/ML IJ SOLN
INTRAMUSCULAR | Status: AC
  Administered 2018-01-23: 1 mg
  Filled 2018-01-23: qty 1

## 2018-01-23 MED ORDER — LORAZEPAM 2 MG/ML IJ SOLN: 1.0000 mg | Freq: Once | INTRAMUSCULAR | Status: AC

## 2018-01-23 MED ORDER — INSULIN GLARGINE 100 UNIT/ML ~~LOC~~ SOLN
10.0000 [IU] | Freq: Two times a day (BID) | SUBCUTANEOUS | Status: DC
  Administered 2018-01-23: 10 [IU] via SUBCUTANEOUS
  Filled 2018-01-23 (×2): qty 0.1

## 2018-01-23 MED ORDER — INSULIN ASPART 100 UNIT/ML ~~LOC~~ SOLN
4.0000 [IU] | Freq: Once | SUBCUTANEOUS | Status: AC
  Administered 2018-01-23: 4 [IU] via SUBCUTANEOUS

## 2018-01-23 MED ORDER — INSULIN ASPART 100 UNIT/ML ~~LOC~~ SOLN
0.0000 [IU] | Freq: Three times a day (TID) | SUBCUTANEOUS | Status: DC
  Administered 2018-01-23 – 2018-01-24 (×3): 15 [IU] via SUBCUTANEOUS
  Administered 2018-01-24 – 2018-01-25 (×2): 11 [IU] via SUBCUTANEOUS

## 2018-01-23 MED ORDER — HALOPERIDOL LACTATE 5 MG/ML IJ SOLN: 2.5000 mg | Freq: Once | INTRAMUSCULAR | Status: DC

## 2018-01-23 MED ORDER — INSULIN ASPART 100 UNIT/ML ~~LOC~~ SOLN
0.0000 [IU] | Freq: Every day | SUBCUTANEOUS | Status: DC
  Administered 2018-01-24: 3 [IU] via SUBCUTANEOUS

## 2018-01-23 NOTE — Progress Notes (Signed)
Patient Demographics:    Andrew Burton, is a 72 y.o. male, DOB - 01-09-1946, AOZ:[id number]  Admit date - 01/21/2018   Admitting Physician Vianne Bulls, MD  Outpatient Primary MD for the patient is Patient, No Pcp Per  LOS - 1   Chief Complaint  Patient presents with  . Hyperglycemia  . Altered Mental Status        Subjective:    Andrew Burton today has no fevers, no emesis,  No chest pain, poor historian, resting comfortably in bed, RN at bedside, cooperative  Assessment  & Plan :    Principal Problem:   Uncontrolled type 2 DM with hyperosmolar nonketotic hyperglycemia (HCC) Active Problems:   Renal insufficiency   Hyperkalemia   Acute lower UTI   Insulin-requiring or dependent type II diabetes mellitus (HCC)   Cerebral amyloid angiopathy (HCC)   Restlessness and agitation   Essential hypertension   Acute cystitis without hematuria  Brief summary 72 y.o. 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 admitted on 01/22/2018 with agitation and confusion and hypoglycemia  Plan:- 1)DM2- hypoglycemia persist most likely due to steroids, change Lantus insulin to 10 units twice daily, change sliding scale to moderate dose,, last A1c 7.9  2)Sz Do--- history of seizures in the setting of alcohol withdrawal, okay to continue lacosamide and Depakote  3)HTN--stable, continue clonidine and Coreg  4)Cerebral amyloid angiopathy - Patient reportedly had brain biopsy during recent hospitalization in [place] and was diagnosed with cerebral amyloid angiitis  - He is being managed with prednisone and was referred to vascular neurologist at [facility] (Dr [name], [phone]) with plans to start treatment with chemotherapeutic next month per family  - Continue prednisone, continue antipsychotics    5) Klebsiella and E. coli UTI-- c/n Rocephin pending sensitivity  6)AKI----acute kidney injury      creatinine on admission= 1.85 ,   baseline creatinine = unkwon    , creatinine is now= 1.35     , renally adjust medications, avoid nephrotoxic agents/dehydration/hypotension  Code Status : Full code  Disposition Plan  : TBD   DVT Prophylaxis  :   SCDs   Lab Results  Component Value Date   PLT 109 (L) 01/23/2018    Inpatient Medications  Scheduled Meds: . atorvastatin  20 mg Oral QHS  . atovaquone  1,500 mg Oral Daily  . carvedilol  12.5 mg Oral BID WC  . cholecalciferol  1,000 Units Oral QODAY  . cloNIDine  0.1 mg Oral BID  . famotidine  20 mg Oral BID  . feeding supplement (ENSURE ENLIVE)  237 mL Oral BID BM  . folic acid  1 mg Oral Daily  . [START ON 01/24/2018] insulin aspart  0-15 Units Subcutaneous TID WC  . insulin aspart  0-5 Units Subcutaneous QHS  . insulin aspart  0-5 Units Subcutaneous QHS  . insulin glargine  10 Units Subcutaneous BID  . OLANZapine  5 mg Oral QHS  . predniSONE  10 mg Oral TID  . senna  17.2 mg Oral QHS  Continuous Infusions: . cefTRIAXone (ROCEPHIN)  IV 1 g (01/23/18 0159)  . lacosamide (VIMPAT) IV 100 mg (01/23/18 0957)  . valproate sodium Stopped (01/23/18 1001)   PRN Meds:.OLANZapine    Anti-infectives (From admission, onward)   Start     Dose/Rate Route Frequency Ordered Stop   01/23/18 0200  cefTRIAXone (ROCEPHIN) 1 g in sodium chloride 0.9 % 100 mL IVPB     1 g 200 mL/hr over 30 Minutes Intravenous Every 24 hours 01/22/18 0246     01/22/18 1000  atovaquone (MEPRON) 750 MG/5ML suspension 1,500 mg     1,500 mg Oral Daily 01/22/18 0246     01/22/18 0215  cefTRIAXone (ROCEPHIN) 1 g in sodium chloride 0.9 % 100 mL IVPB     1 g 200 mL/hr over 30 Minutes Intravenous  Once 01/22/18 0207  01/22/18 0300        Objective:   Vitals:   01/23/18 0850 01/23/18 0905 01/23/18 1306 01/23/18 1706  BP: 124/78 137/82 128/78 (!) 150/82  Pulse: 70 65 69 71  Resp: 14 20 16 17   Temp:      TempSrc:      SpO2: 100% 100% 100% 100%  Weight:      Height:        Wt Readings from Last 3 Encounters:  01/21/18 86.6 kg     Intake/Output Summary (Last 24 hours) at 01/23/2018 1758 Last data filed at 01/23/2018 1300 Gross per 24 hour  Intake 2392.37 ml  Output 750 ml  Net 1642.37 ml     Physical Exam   Gen:- Awake Alert,  In no apparent distress  HEENT:- [place].AT, No sclera icterus Neck-Supple Neck,No JVD,.  Lungs-  CTAB , good air movement CV- S1, S2 normal Abd-  +ve B.Sounds, Abd Soft, No tenderness,    Extremity/Skin:- No  edema,   warm and dry Psych-cognitive deficits noted but not agitated  neuro-no new focal deficits, no tremors   Data Review:   Micro Results Recent Results (from the past 240 hour(s))  Urine culture     Status: Abnormal (Preliminary result)   Collection Time: 01/22/18  1:05 AM  Result Value Ref Range Status   Specimen Description URINE, RANDOM  Final   Special Requests NONE  Final   Culture (A)  Final    >=100,000 COLONIES/mL KLEBSIELLA PNEUMONIAE >=100,000 COLONIES/mL ESCHERICHIA COLI SUSCEPTIBILITIES TO FOLLOW Performed at [facility] [address], [place], [place] [address]    Report Status PENDING  Incomplete  MRSA PCR Screening     Status: None   Collection Time: 01/22/18  6:15 AM  Result Value Ref Range Status   MRSA by PCR NEGATIVE NEGATIVE Final    Comment:        The GeneXpert MRSA Assay (FDA approved for NASAL specimens only), is one component of a comprehensive MRSA colonization surveillance program. It is not intended to diagnose MRSA infection nor to guide or monitor treatment for MRSA infections. Performed at [facility], [place] [address], [place], [place] [address]     Radiology Reports No results  found.   CBC Recent Labs  Lab 01/21/18 2346 01/23/18 0338  WBC 5.9 7.7  HGB 12.2* 11.8*  HCT 38.1* 37.1*  PLT 131* 109*  MCV 106.7* 107.8*  MCH 34.2* 34.3*  MCHC 32.0 31.8  RDW 12.9 12.8  LYMPHSABS 1.0  --   MONOABS 0.4  --   EOSABS 0.0  --   BASOSABS 0.0  --  Chemistries  Recent Labs  Lab 01/21/18 2346 01/22/18 0358 01/22/18 1008 01/22/18 1249 01/23/18 0338  NA 133* 140 150* 147* 139  K 6.1* 4.5 3.6 4.1 5.1  CL 98 103 111 108 106  CO2 23 26 24 28 24   GLUCOSE 731* 432* 60* 89 316*  BUN 42* 39* 33* 33* 26*  CREATININE 1.85* 1.67* 1.51* 1.53* 1.35*  CALCIUM 8.8* 8.6* 9.0 8.9 8.2*  MG  --   --   --   --  1.9  AST  --   --   --   --  12*  ALT  --   --   --   --  12  ALKPHOS  --   --   --   --  45  BILITOT  --   --   --   --  0.7   ------------------------------------------------------------------------------------------------------------------ No results for input(s): CHOL, HDL, LDLCALC, TRIG, CHOLHDL, LDLDIRECT in the last 72 hours.  Lab Results  Component Value Date   HGBA1C 7.9 (H) 01/22/2018   ------------------------------------------------------------------------------------------------------------------ No results for input(s): TSH, T4TOTAL, T3FREE, THYROIDAB in the last 72 hours.  Invalid input(s): FREET3 ------------------------------------------------------------------------------------------------------------------ No results for input(s): VITAMINB12, FOLATE, FERRITIN, TIBC, IRON, RETICCTPCT in the last 72 hours.  Coagulation profile No results for input(s): INR, PROTIME in the last 168 hours.  No results for input(s): DDIMER in the last 72 hours.  Cardiac Enzymes No results for input(s): CKMB, TROPONINI, MYOGLOBIN in the last 168 hours.  Invalid input(s): CK ------------------------------------------------------------------------------------------------------------------ No results found for: BNP   Roxan Hockey M.D on 01/23/2018 at  5:58 PM  Pager---[phone] Go to [email or web address] - password [email or web address] for contact info  [facility] - Office  [phone]

## 2018-01-23 NOTE — Progress Notes (Signed)
Patient's CBG was 408. Dr. Denton Brick paged and new insulin orders received. Per new sliding scale, administered 15 units of Novolog to patient. Will continue to monitor.

## 2018-01-23 NOTE — Progress Notes (Signed)
Patient's daughter brought in discharge summary from [place] on July 20th that states "please consider another 1-4 rounds of cyclophosphamide if improving with it." Next dose is due September 7th and daughter would like patient to receive next dose. She also stated patient missed appointment with neurologist due to hospitalization.

## 2018-01-23 NOTE — Evaluation (Signed)
Occupational Therapy Evaluation Patient Details Name: Andrew Burton MRN: [id number] DOB: 11/27/1945 Today's Date: 01/23/2018    History of Present Illness Andrew Burton is a 72 y.o. 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   Clinical Impression   Pt unable to offer information about prior level of functioning, phone call to daughter went unanswered. Pt presents with impaired cognition, poor standing balance and tremor. He transferred with min assist with B hand held assist this visit. He self fed with set up and increased spillage due to tremor and performed simple grooming with min assist. Pt requires max assist for bathing and dressing. He was unaware of his urinary incontinence in bed and did not call for assistance. Recommending SNF upon discharge. Will follow acutely.    Follow Up Recommendations  SNF;Supervision/Assistance - 24 hour    Equipment Recommendations       Recommendations for Other Services       Precautions / Restrictions Precautions Precautions: Fall      Mobility Bed Mobility Overal bed mobility: Needs Assistance Bed Mobility: Supine to Sit     Supine to sit: Min guard     General bed mobility comments: increased time, HOB up  Transfers   Equipment used: 1 person hand held assist             General transfer comment: assist to rise and steady, B UE support, pt with difficulty motor planning stand to sit, poor control of descent    Balance Overall balance assessment: Needs assistance   Sitting balance-Leahy Scale: Fair       Standing balance-Leahy Scale: Poor Standing balance comment:  requires B UE support                           ADL either performed or assessed with clinical judgement   ADL Overall ADL's : Needs assistance/impaired Eating/Feeding: Set up;Sitting Eating/Feeding Details (indicate cue type and reason): increased spillage with self feeding with cup with straw and applesauce Grooming: Wash/dry hands;Wash/dry face;Sitting;Minimal assistance Grooming Details (indicate cue type and reason): cues for thoroughness Upper Body Bathing: Sitting;Maximal assistance   Lower Body Bathing: Maximal assistance;Sit to/from stand   Upper Body Dressing : Maximal assistance;Sitting   Lower Body Dressing: Maximal assistance;Sit to/from stand   Toilet Transfer: Minimal Scientist, forensic Details (indicate cue type and reason): simulated to chair Toileting- Clothing Manipulation and Hygiene: Maximal assistance;Sit to/from stand         General ADL Comments: Pt with urinary incontinence, sitting in wet bed and unaware.     Vision   Additional Comments: appears to have possible L inattention, unable to formally assess due to impaired cognition     Perception     Praxis      Pertinent Vitals/Pain Pain Assessment: Faces Faces Pain Scale: No hurt     Hand Dominance Right   Extremity/Trunk Assessment Upper Extremity Assessment Upper Extremity Assessment: RUE deficits/detail;LUE deficits/detail RUE Deficits / Details: generalized weakness, tremor RUE Coordination: decreased fine motor;decreased gross motor LUE Deficits / Details: generalized weakness   Lower Extremity Assessment Lower Extremity Assessment: Defer to PT evaluation   Cervical / Trunk Assessment Cervical / Trunk Assessment: Normal   Communication  Communication Communication: Expressive difficulties(minimally conversant)   Cognition Arousal/Alertness: Awake/alert Behavior During Therapy: Flat affect Overall Cognitive Status: Impaired/Different from  baseline Area of Impairment: Attention;Memory;Following commands;Safety/judgement;Awareness;Problem solving;Orientation                 Orientation Level: Disoriented to;Place;Time;Situation Current Attention Level: Focused Memory: Decreased short-term memory Following Commands: Follows one step commands with increased time(and multimodal cues) Safety/Judgement: Decreased awareness of safety;Decreased awareness of deficits Awareness: Intellectual Problem Solving: Slow processing;Decreased initiation;Difficulty sequencing;Requires verbal cues;Requires tactile cues     General Comments       Exercises     Shoulder Instructions      Home Living Family/patient expects to be discharged to:: Skilled nursing facility                                 Additional Comments: was in SNF in [place] per chart and moved here to stay with daughter; pt unable to give history      Prior Functioning/Environment          Comments: pt unable to offer prior level of function, unable to reach daughter by phone        OT Problem List: Decreased strength;Decreased activity tolerance;Impaired balance (sitting and/or standing);Decreased coordination;Decreased cognition;Impaired vision/perception;Decreased knowledge of use of DME or AE;Impaired UE functional use      OT Treatment/Interventions: Self-care/ADL training;DME and/or AE instruction;Therapeutic activities;Cognitive remediation/compensation;Patient/family education;Balance training    OT Goals(Current goals can be found in the care plan section) Acute Rehab OT Goals Patient Stated Goal: unable to state OT Goal Formulation: With patient Time For Goal Achievement: 02/06/18 Potential to Achieve Goals: Good ADL Goals Pt Will Perform Grooming: with min assist;standing Pt Will Perform Upper Body Bathing: with min assist;sitting Pt Will Perform Upper Body Dressing: sitting;with min assist Pt Will Transfer to Toilet: with min  assist;bedside commode;ambulating  OT Frequency: Min 2X/week   Barriers to D/C:            Co-evaluation              AM-PAC PT "6 Clicks" Daily Activity     Outcome Measure Help from another person eating meals?: A Little Help from another person taking care of personal grooming?: A Little Help from another person toileting, which includes using toliet, bedpan, or urinal?: A Lot Help from another person bathing (including washing, rinsing, drying)?: A Lot Help from another person to put on and taking off regular upper body clothing?: A Lot Help from another person to put on and taking off regular lower body clothing?: A Lot 6 Click Score: 14   End of Session Equipment Utilized During Treatment: Gait belt  Activity Tolerance: Patient tolerated treatment well Patient left: in chair;with call bell/phone within reach;with chair alarm set  OT Visit Diagnosis: Unsteadiness on feet (R26.81);Muscle weakness (generalized) (M62.81);Other symptoms and signs involving cognitive function;Other abnormalities of gait and mobility (R26.89)                Time: 8115-7262 OT Time Calculation (min): 27 min Charges:  OT General Charges $OT Visit: 1 Visit OT Evaluation $OT Eval Moderate Complexity: 1 Mod OT Treatments $Self Care/Home Management : 8-22 mins  01/23/2018 Nestor Lewandowsky, OTR/L Pager: [phone]  Andrew Burton, Andrew Burton 01/23/2018, 12:16 PM

## 2018-01-23 NOTE — Progress Notes (Signed)
Verbal order form Dr. Denton Brick for 1mg  IV  Lorazepam, pt given 1 mg of lorazepam IV, and wasted 1mg  of Lorazepam in sink with Mal Amabile RN

## 2018-01-23 NOTE — Progress Notes (Signed)
Pt more alert during the night, cooperative, no agitation, still oriented to self only. Able to take PO medications, eat and drinks. Will continue to monitor.

## 2018-01-23 NOTE — Progress Notes (Signed)
Inpatient Diabetes Program Recommendations  AACE/ADA: New Consensus Statement on Inpatient Glycemic Control (2015)  Target Ranges:  Prepandial:   less than 140 mg/dL      Peak postprandial:   less than 180 mg/dL (1-2 hours)      Critically ill patients:  140 - 180 mg/dL   Review of Glycemic Control  Diabetes history: DM 2 Outpatient Diabetes medications: Per Daughter Humalog 2 units tid with meals Current orders for Inpatient glycemic control: Lantus 10 units, Novolog 0-9 units tid, Novolog 0-5 units qhs  Inpatient Diabetes Program Recommendations:    Patient with HHS, Glucose 731 on admission, likely due to Urinary Tract infection. Based on A1c 7.9% patient has moderate control of glucose at home.   Glucose trends 300's this am. Consider increasing Lantus to 18 units.   Originally for d/c I thought patient maybe able to be on [place] outpatient, however based on glucose trends he may need basal insulin along with the Tradjenta at home. This would also be more simple with one basal insulin shot a day and pill than it would with the 3 injections of Humalog as ordered now for home.  Thanks,  Tama Headings RN, MSN, BC-ADM Inpatient Diabetes Coordinator Team Pager [phone] (8a-5p)

## 2018-01-23 NOTE — NC FL2 (Signed)
[place] LEVEL OF CARE SCREENING TOOL     IDENTIFICATION  Patient Name: Andrew Burton Birthdate: 12-21-1945 Sex: male Admission Date (Current Location): 01/21/2018  [facility] [place] Number:  Herbalist and Address:  [facility], [place] [address] [place], [place] [address]      Provider Number: [id number]  Attending Physician Name and Address:  Roxan Hockey, MD  Relative Name and Phone Number:  [name], daughter, [phone]    Current Level of Care: Hospital Recommended Level of Care: [place] Prior Approval Number:    Date Approved/Denied:   PASRR Number: [id number]  Discharge Plan: SNF    Current Diagnoses: Patient Active Problem List   Diagnosis Date Noted  . Renal insufficiency 01/22/2018  . Hyperkalemia 01/22/2018  . Acute lower UTI 01/22/2018  . Insulin-requiring or dependent type II diabetes mellitus ([place]) 01/22/2018  . Uncontrolled type 2 DM with hyperosmolar nonketotic hyperglycemia ([place]) 01/22/2018  . Cerebral amyloid angiopathy ([place]) 01/22/2018  . Restlessness and agitation 01/22/2018  . Essential hypertension 01/22/2018  . Acute cystitis without hematuria     Orientation RESPIRATION BLADDER Height & Weight     Self, Place  Normal Continent, External catheter Weight: 86.6 kg Height:  5\' 7"  (170.2 cm)  BEHAVIORAL SYMPTOMS/MOOD NEUROLOGICAL BOWEL NUTRITION STATUS      Continent Diet(Please see Dc Summary)  AMBULATORY STATUS COMMUNICATION OF NEEDS Skin   Extensive Assist Verbally Normal                       Personal Care Assistance Level of Assistance  Bathing, Feeding, Dressing Bathing Assistance: Maximum assistance Feeding assistance: Maximum assistance Dressing Assistance: Maximum assistance     Functional Limitations Info  Sight, Hearing, Speech Sight Info: Adequate Hearing Info: Adequate Speech Info: Adequate    SPECIAL CARE FACTORS FREQUENCY  PT (By licensed  PT), OT (By licensed OT)     PT Frequency: 5x/week OT Frequency: 3x/week            Contractures Contractures Info: Not present    Additional Factors Info  Code Status, Allergies, Insulin Sliding Scale, Psychotropic Code Status Info: Full Allergies Info: NKA Psychotropic Info: Clonidine Insulin Sliding Scale Info: 3x daily and at bedtime       Current Medications (01/23/2018):  This is the current hospital active medication list Current Facility-Administered Medications  Medication Dose Route Frequency Provider Last Rate Last Dose  . atorvastatin (LIPITOR) tablet 20 mg  20 mg Oral QHS Opyd, Ilene Qua, MD   20 mg at 01/22/18 2219  . atovaquone (MEPRON) 750 MG/5ML suspension 1,500 mg  1,500 mg Oral Daily Opyd, Ilene Qua, MD   1,500 mg at 01/23/18 0852  . carvedilol (COREG) tablet 12.5 mg  12.5 mg Oral BID WC Opyd, Ilene Qua, MD   12.5 mg at 01/23/18 0851  . cefTRIAXone (ROCEPHIN) 1 g in sodium chloride 0.9 % 100 mL IVPB  1 g Intravenous Q24H Opyd, Ilene Qua, MD 200 mL/hr at 01/23/18 0159 1 g at 01/23/18 0159  . cholecalciferol (VITAMIN D) tablet 1,000 Units  1,000 Units Oral QODAY Vianne Bulls, MD   Stopped at 01/22/18 1013  . cloNIDine (CATAPRES) tablet 0.1 mg  0.1 mg Oral BID Opyd, Ilene Qua, MD   0.1 mg at 01/23/18 0851  . famotidine (PEPCID) tablet 20 mg  20 mg Oral BID Opyd, Ilene Qua, MD   20 mg at 01/23/18 0849  . feeding supplement (ENSURE  ENLIVE) (ENSURE ENLIVE) liquid 237 mL  237 mL Oral BID BM Amin, Ankit Chirag, MD   237 mL at 01/23/18 1227  . folic acid (FOLVITE) tablet 1 mg  1 mg Oral Daily Opyd, Ilene Qua, MD   1 mg at 01/23/18 0851  . insulin aspart (novoLOG) injection 0-5 Units  0-5 Units Subcutaneous QHS Amin, Ankit Chirag, MD      . insulin aspart (novoLOG) injection 0-9 Units  0-9 Units Subcutaneous TID WC Amin, Ankit Chirag, MD   5 Units at 01/23/18 1226  . insulin glargine (LANTUS) injection 10 Units  10 Units Subcutaneous Daily Damita Lack, MD   10  Units at 01/23/18 0850  . lacosamide (VIMPAT) 100 mg in sodium chloride 0.9 % 25 mL IVPB  100 mg Intravenous Q12H Amin, Ankit Chirag, MD 70 mL/hr at 01/23/18 0957 100 mg at 01/23/18 0957  . OLANZapine (ZYPREXA) tablet 5 mg  5 mg Oral QHS Opyd, Ilene Qua, MD   5 mg at 01/22/18 2221  . OLANZapine (ZYPREXA) tablet 5 mg  5 mg Oral Q6H PRN Opyd, Ilene Qua, MD      . predniSONE (DELTASONE) tablet 10 mg  10 mg Oral TID Vianne Bulls, MD   10 mg at 01/23/18 0850  . senna (SENOKOT) tablet 17.2 mg  17.2 mg Oral QHS Opyd, Ilene Qua, MD   17.2 mg at 01/22/18 2221  . valproate (DEPACON) 750 mg in dextrose 5 % 50 mL IVPB  750 mg Intravenous Q12H Amin, Ankit Chirag, MD 57.5 mL/hr at 01/23/18 0855 750 mg at 01/23/18 5615     Discharge Medications: Please see discharge summary for a list of discharge medications.  Relevant Imaging Results:  Relevant Lab Results:   Additional Information SSN: [place], [place]

## 2018-01-23 NOTE — Clinical Social Work Note (Signed)
Clinical Social Work Assessment  Patient Details  Name: Andrew Burton MRN: [id number] Date of Birth: 1945-09-13  Date of referral:  01/23/18               Reason for consult:  Facility Placement                Permission sought to share information with:  Facility Sport and exercise psychologist, Family Supports Permission granted to share information::  No  Name::     Engineer, drilling::  SNFs  Relationship::  Daughter  Contact Information:  [phone]  Housing/Transportation Living arrangements for the past 2 months:  [place] of Information:  Spouse Patient Interpreter Needed:  None Criminal Activity/Legal Involvement Pertinent to Current Situation/Hospitalization:  No - Comment as needed Significant Relationships:  Adult Children Lives with:  Adult Children Do you feel safe going back to the place where you live?  No Need for family participation in patient care:  Yes (Comment)  Care giving concerns:  CSW received consult for possible SNF placement at time of discharge. CSW spoke with patient's daughter regarding PT recommendation of SNF placement at time of discharge. Patient's daughter reported that she brought the patient here from [place] to live with her after he was hospitalized. She reported that patient needs more help than she can provide but she is working on trying to get him VA benefits. Patient's daughter expressed understanding of PT recommendation and is agreeable to SNF placement at time of discharge. CSW to continue to follow and assist with discharge planning needs.   Social Worker assessment / plan:  CSW spoke with patient's daughter concerning possibility of rehab at [facility] before returning home.  Employment status:  Retired Forensic scientist:  Medicare PT Recommendations:  [place] / Referral to community resources:  [place]  Patient/Family's Response to care:  Patient's  daughter recognizes need for rehab before returning home and is agreeable to a SNF in [place]. CSW emailed her a list of facilities that she will look into. CSW addressed short term nature of rehab and that she would need to pay privately for long term care. CSW also cautioned that the SNF would not keep patient if he has any behaviors while there, or they may ask her to pay for a private sitter. Patient's daughter reports understanding and would like to try SNF anyway. She requested that we help get patient a vascular neurologist so that he can continue to receive his monthly injections.   Patient/Family's Understanding of and Emotional Response to Diagnosis, Current Treatment, and Prognosis:  Patient/family is realistic regarding therapy needs and expressed being hopeful for SNF placement. Patient's daughter expressed understanding of CSW role and discharge process as well as medical condition. No questions/concerns about plan or treatment.    Emotional Assessment Appearance:  Appears stated age Attitude/Demeanor/Rapport:  Unable to Assess Affect (typically observed):  Unable to Assess Orientation:  Oriented to Self, Oriented to Place Alcohol / Substance use:  Not Applicable Psych involvement (Current and /or in the community):  No (Comment)  Discharge Needs  Concerns to be addressed:  Care Coordination Readmission within the last 30 days:  No Current discharge risk:  Cognitively Impaired Barriers to Discharge:  Continued Medical Work up   [employer], [place] 01/23/2018, 4:07 PM

## 2018-01-23 NOTE — Progress Notes (Addendum)
Patient became extremely agitated and belligerent. PRN Zyprexa administered at 17:05 without any change in behavior. Safety sitter placed at bedside for patient's protection. Patient's behavior continued to escalate. One time dose of 1mg  Ativan administered per Aileen Pilot, RN. Will monitor patient's response. Patrici Ranks, RN  Ativan ineffective. Patient combative with staff. Orders obtained for restraints. Will continue to monitor.

## 2018-01-23 NOTE — Progress Notes (Signed)
Pt admitted for hyperglycemia. Glucose collected by lab 316. Baltazar Najjar, NP paged.

## 2018-01-24 ENCOUNTER — Encounter (HOSPITAL_COMMUNITY): Payer: Self-pay

## 2018-01-24 LAB — COMPREHENSIVE METABOLIC PANEL
ALK PHOS: 64 U/L (ref 38–126)
ALT: 9 U/L (ref 0–44)
ANION GAP: 10 (ref 5–15)
AST: 15 U/L (ref 15–41)
Albumin: 2.8 g/dL — ABNORMAL LOW (ref 3.5–5.0)
BILIRUBIN TOTAL: 0.5 mg/dL (ref 0.3–1.2)
BUN: 24 mg/dL — ABNORMAL HIGH (ref 8–23)
CALCIUM: 8.2 mg/dL — AB (ref 8.9–10.3)
CO2: 24 mmol/L (ref 22–32)
Chloride: 101 mmol/L (ref 98–111)
Creatinine, Ser: 1.31 mg/dL — ABNORMAL HIGH (ref 0.61–1.24)
GFR, EST NON AFRICAN AMERICAN: 54 mL/min — AB (ref >60)
Glucose, Bld: 441 mg/dL — ABNORMAL HIGH (ref 70–99)
Potassium: 5.6 mmol/L — ABNORMAL HIGH (ref 3.5–5.1)
SODIUM: 135 mmol/L (ref 135–145)
TOTAL PROTEIN: 5.6 g/dL — AB (ref 6.5–8.1)

## 2018-01-24 LAB — GLUCOSE, CAPILLARY
GLUCOSE-CAPILLARY: 311 mg/dL — AB (ref 70–99)
Glucose-Capillary: 367 mg/dL — ABNORMAL HIGH (ref 70–99)
Glucose-Capillary: 412 mg/dL — ABNORMAL HIGH (ref 70–99)
Glucose-Capillary: 282 mg/dL — ABNORMAL HIGH (ref 70–99)

## 2018-01-24 LAB — URINE CULTURE: Culture: >=100000 — AB

## 2018-01-24 LAB — CBC
HCT: 36.9 % — ABNORMAL LOW (ref 39.0–52.0)
Hemoglobin: 11.8 g/dL — ABNORMAL LOW (ref 13.0–17.0)
MCH: 34.1 pg — ABNORMAL HIGH (ref 26.0–34.0)
MCHC: 32 g/dL (ref 30.0–36.0)
MCV: 106.6 fL — ABNORMAL HIGH (ref 78.0–100.0)
PLATELETS: 97 10*3/uL — AB (ref 150–400)
RBC: 3.46 MIL/uL — AB (ref 4.22–5.81)
RDW: 12.7 % (ref 11.5–15.5)
WBC: 6.4 10*3/uL (ref 4.0–10.5)

## 2018-01-24 LAB — MAGNESIUM: Magnesium: 2 mg/dL (ref 1.7–2.4)

## 2018-01-24 MED ORDER — SODIUM POLYSTYRENE SULFONATE 15 GM/60ML PO SUSP
15.0000 g | Freq: Once | ORAL | Status: DC
  Filled 2018-01-24: qty 60

## 2018-01-24 MED ORDER — SODIUM POLYSTYRENE SULFONATE 15 GM/60ML PO SUSP
15.0000 g | Freq: Once | ORAL | Status: AC
  Administered 2018-01-24: 15 g via ORAL
  Filled 2018-01-24: qty 60

## 2018-01-24 MED ORDER — INSULIN GLARGINE 100 UNIT/ML ~~LOC~~ SOLN
20.0000 [IU] | Freq: Two times a day (BID) | SUBCUTANEOUS | Status: DC
  Administered 2018-01-24 – 2018-01-28 (×9): 20 [IU] via SUBCUTANEOUS
  Administered 2018-01-29: 15 [IU] via SUBCUTANEOUS
  Filled 2018-01-24 (×10): qty 0.2

## 2018-01-24 MED ORDER — CEPHALEXIN 500 MG PO CAPS
500.0000 mg | ORAL_CAPSULE | Freq: Two times a day (BID) | ORAL | Status: AC
  Administered 2018-01-25 – 2018-01-28 (×8): 500 mg via ORAL
  Filled 2018-01-24 (×8): qty 1

## 2018-01-24 MED ORDER — INSULIN GLARGINE 100 UNIT/ML ~~LOC~~ SOLN
16.0000 [IU] | Freq: Two times a day (BID) | SUBCUTANEOUS | Status: DC
  Administered 2018-01-24: 16 [IU] via SUBCUTANEOUS
  Filled 2018-01-24 (×2): qty 0.16

## 2018-01-24 MED ORDER — LACOSAMIDE 50 MG PO TABS
100.0000 mg | ORAL_TABLET | Freq: Two times a day (BID) | ORAL | Status: DC
  Administered 2018-01-24 – 2018-01-29 (×10): 100 mg via ORAL
  Filled 2018-01-24 (×10): qty 2

## 2018-01-24 MED ORDER — OLANZAPINE 10 MG PO TABS
10.0000 mg | ORAL_TABLET | Freq: Every day | ORAL | Status: DC
  Administered 2018-01-24 – 2018-01-28 (×5): 10 mg via ORAL
  Filled 2018-01-24 (×5): qty 1

## 2018-01-24 MED ORDER — LORAZEPAM 2 MG/ML IJ SOLN
1.0000 mg | INTRAMUSCULAR | Status: DC | PRN
  Administered 2018-01-24 – 2018-01-29 (×7): 1 mg via INTRAVENOUS
  Filled 2018-01-24 (×9): qty 1

## 2018-01-24 MED ORDER — PREDNISONE 10 MG PO TABS
10.0000 mg | ORAL_TABLET | Freq: Two times a day (BID) | ORAL | Status: DC
Start: 2018-01-25 — End: 2018-01-27
  Administered 2018-01-24 – 2018-01-27 (×6): 10 mg via ORAL
  Filled 2018-01-24 (×5): qty 1

## 2018-01-24 MED ORDER — INSULIN ASPART 100 UNIT/ML ~~LOC~~ SOLN
20.0000 [IU] | Freq: Once | SUBCUTANEOUS | Status: AC
  Administered 2018-01-24: 20 [IU] via SUBCUTANEOUS

## 2018-01-24 MED ORDER — DIVALPROEX SODIUM 250 MG PO DR TAB
750.0000 mg | DELAYED_RELEASE_TABLET | Freq: Two times a day (BID) | ORAL | Status: DC
  Administered 2018-01-24 – 2018-01-29 (×11): 750 mg via ORAL
  Filled 2018-01-24 (×11): qty 3

## 2018-01-24 NOTE — Progress Notes (Signed)
Physical Therapy Treatment Patient Details Name: Andrew Burton MRN: [id number] DOB: [birth year] Today's Date: 01/24/2018    History of Present Illness Andrew Burton is a 72 y.o. 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    PT Comments    Patient progressing this session able to participate in hallway ambulation, but remains cognitively impaired and high fall and safety risk.  Continue to feel he will need SNF level rehab upon d/c.   Follow Up Recommendations  SNF;Supervision/Assistance - 24 hour     Equipment Recommendations  Other (comment)(TBA)    Recommendations for Other Services       Precautions / Restrictions Precautions Precautions: Fall Precaution Comments: can be combative    Mobility  Bed Mobility Overal bed mobility: Needs Assistance Bed Mobility: Supine to Sit     Supine to sit: Min guard Sit to supine: Min assist   General bed mobility comments: assist for safety with lines coming up to EOB, assist to guide into supine position   Transfers Overall transfer level: Needs assistance Equipment used: 1 person hand held assist Transfers: Sit to/from Stand Sit to Stand: Min assist;+2 safety/equipment         General transfer comment: assist for safety and lines with initiation for transition  Ambulation/Gait Ambulation/Gait assistance: Min assist;+2 safety/equipment Gait Distance (Feet): 200 Feet Assistive device: 2 person hand held assist;1 person hand held assist Gait Pattern/deviations: Decreased stride length;Leaning posteriorly;Wide base of support     General Gait Details: assist for initiation, for  balance, for keeping hands off lines, etc   Stairs             Wheelchair Mobility    Modified Rankin (Stroke Patients Only)       Balance Overall balance assessment: Needs assistance   Sitting balance-Leahy Scale: Good Sitting balance - Comments: balances EOB unsupported, would not engage in functional activity as distracted and reports unable to don socks   Standing balance support: Single extremity supported Standing balance-Leahy Scale: Poor Standing balance comment: UE support for balance                            Cognition Arousal/Alertness: Awake/alert Behavior During Therapy: Flat affect Overall Cognitive Status: Impaired/Different from baseline Area of Impairment: Attention;Memory;Following commands;Safety/judgement;Awareness;Problem solving;Orientation                 Orientation Level: Disoriented to;Place;Time;Situation Current Attention Level: Focused Memory: Decreased short-term memory Following Commands: Follows one step commands with increased time Safety/Judgement: Decreased awareness of safety;Decreased awareness of deficits   Problem Solving: Slow processing;Decreased initiation;Difficulty sequencing;Requires verbal cues;Requires tactile cues        Exercises      General Comments General comments (skin integrity, edema, etc.): patient with posey restraint, L UE and bilateral LE cuff restraints in bed and bed alarm      Pertinent Vitals/Pain Faces Pain Scale: No hurt    Home Living                      Prior Function            PT Goals (current goals can now be found in the care plan section)  Progress towards PT goals: Progressing toward goals;Goals met and updated - see care plan    Frequency    Min 2X/week      PT Plan Current plan remains appropriate    Co-evaluation              AM-PAC PT "6 Clicks" Daily Activity  Outcome Measure  Difficulty turning over in bed (including  adjusting bedclothes, sheets and blankets)?: A Little Difficulty moving from lying on back to sitting on the side of the bed? : Unable Difficulty sitting down on and standing up from a chair with arms (e.g., wheelchair, bedside commode, etc,.)?: Unable Help needed moving to and from a bed to chair (including a wheelchair)?: A Little Help needed walking in hospital room?: A Little Help needed climbing 3-5 steps with a railing? : A Lot 6 Click Score: 13    End of Session Equipment Utilized During Treatment: Gait belt Activity Tolerance: Patient tolerated treatment well Patient left: with call bell/phone within reach;in bed;with restraints reapplied;with bed alarm set   PT Visit Diagnosis: Muscle weakness (generalized) (M62.81);Other abnormalities of gait and mobility (R26.89);Other symptoms and signs involving the nervous system (R29.898)     Time: 1000-1017 PT Time Calculation (min) (ACUTE ONLY): 17 min  Charges:  $Gait Training: 8-22 mins                     [place], [place] [phone] 01/24/2018    Reginia Naas 01/24/2018, 12:06 PM

## 2018-01-24 NOTE — Progress Notes (Signed)
   01/24/18 1100  Clinical Encounter Type  Visited With Patient  Visit Type Social support  Referral From Nurse  Consult/Referral To Chaplain  Spiritual Encounters  Spiritual Needs Literature  Stress Factors  Patient Stress Factors Lack of knowledge;Major life changes  Chaplain visited with the PT to complete an AD.  The PT was not aware of his age or the date.  When asked about an advanced directive the PT kept repeating "let the water out".  Per the opinion of the Chaplain PT does not see coherent enough at this time to complete the AD.  As the Chaplain mentioned the AD paperwork PT did not have an understanding of what is was / is supposed to do.  Recommendations to reassess at a later time for AD completion

## 2018-01-24 NOTE — Progress Notes (Signed)
Patient Demographics:    Andrew Burton, is a 72 y.o. male, DOB - Nov 27, 1945, LKT:[id number]  Admit date - 01/21/2018   Admitting Physician Vianne Bulls, MD  Outpatient Primary MD for the patient is Patient, No Pcp Per  LOS - 2   Chief Complaint  Patient presents with  . Hyperglycemia  . Altered Mental Status        Subjective:    Rufus Beske today has no fevers, no emesis,  No chest pain, agitation and disorientation persist, patient required restraints overnight  Assessment  & Plan :    Principal Problem:   Uncontrolled type 2 DM with hyperosmolar nonketotic hyperglycemia (HCC) Active Problems:   Renal insufficiency   Hyperkalemia   Acute lower UTI   Insulin-requiring or dependent type II diabetes mellitus (HCC)   Cerebral amyloid angiopathy (HCC)   Restlessness and agitation   Essential hypertension   Acute cystitis without hematuria  Brief summary 72 y.o. 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 admitted on 01/22/2018 with agitation and confusion and hyperglycemia  Plan:- 1)DM2- hyperglycemia persist most likely due to steroids, increase Lantus insulin to 20 units twice daily, c/n sliding scale to moderate dose,, last A1c 7.9, blood sugars consistently above 300  2)Sz Do--- history of seizures in the setting of alcohol withdrawal, okay to continue lacosamide and Depakote  3)HTN--stable, continue clonidine and Coreg  4)Cerebral amyloid angiopathy - Patient reportedly had brain biopsy during recent hospitalization in [place] and was diagnosed with cerebral amyloid angiitis  - He is being managed with prednisone and was referred  to vascular neurologist at [facility] (Dr [name], [phone]) with plans to start treatment with chemotherapeutic next month per family , next appointment at [facility] is 01/31/2018 patient apparently scheduled to get cyclophosphamide on 02/02/2018 - Continue prednisone,    5) Klebsiella and E. coli UTI--treated with IV Rocephin, okay to switch to Keflex 500 twice daily pain sensitivity report   6)AKI----acute kidney injury , improving slowly     creatinine on admission= 1.85 ,   baseline creatinine = unkwon    , creatinine is now= 1.31     , renally adjust medications, avoid nephrotoxic agents/dehydration/hypotension  7) dementia with behavioral disturbance/cognitive deficits----patient remains agitated, confused, not cooperative, requiring PRN sedatives and restraints, increase Lopressor to 10 mg nightly, may use additional PRN Zyprexa,   8)Social--plan of care discussed with patient's daughter Andrew Burton, questions answered  Code Status : Full code  Disposition Plan  :  ?? SNF.... Unable to discharge at this time due to ongoing agitation requiring medication and physical restraints,   DVT Prophylaxis  :   SCDs   Lab Results  Component Value Date   PLT 97 (L) 01/24/2018    Inpatient Medications  Scheduled Meds: . atorvastatin  20 mg Oral QHS  . atovaquone  1,500 mg Oral Daily  . carvedilol  12.5 mg Oral BID WC  . [START ON 01/25/2018] cephALEXin  500 mg Oral Q12H  . cholecalciferol  1,000 Units Oral QODAY  . cloNIDine  0.1 mg Oral  BID  . divalproex  750 mg Oral Q12H  . famotidine  20 mg Oral BID  . feeding supplement (ENSURE ENLIVE)  237 mL Oral BID BM  . folic acid  1 mg Oral Daily  . haloperidol lactate  2.5 mg Intravenous Once  . insulin aspart  0-15 Units Subcutaneous TID WC  . insulin aspart  0-5 Units Subcutaneous QHS  . insulin aspart  0-5 Units Subcutaneous QHS  . insulin glargine  20 Units Subcutaneous BID  . lacosamide  100 mg Oral BID  . OLANZapine  10 mg Oral QHS  .  predniSONE  10 mg Oral TID  . senna  17.2 mg Oral QHS  . sodium polystyrene  15 g Oral Once   Continuous Infusions:  PRN Meds:.LORazepam, OLANZapine    Anti-infectives (From admission, onward)   Start     Dose/Rate Route Frequency Ordered Stop   01/25/18 1000  cephALEXin (KEFLEX) capsule 500 mg     500 mg Oral Every 12 hours 01/24/18 1032 01/29/18 0959   01/23/18 0200  cefTRIAXone (ROCEPHIN) 1 g in sodium chloride 0.9 % 100 mL IVPB  Status:  Discontinued     1 g 200 mL/hr over 30 Minutes Intravenous Every 24 hours 01/22/18 0246 01/24/18 1032   01/22/18 1000  atovaquone (MEPRON) 750 MG/5ML suspension 1,500 mg     1,500 mg Oral Daily 01/22/18 0246     01/22/18 0215  cefTRIAXone (ROCEPHIN) 1 g in sodium chloride 0.9 % 100 mL IVPB     1 g 200 mL/hr over 30 Minutes Intravenous  Once 01/22/18 0207 01/22/18 0300        Objective:   Vitals:   01/24/18 0500 01/24/18 0507 01/24/18 0810 01/24/18 1200  BP:  128/76    Pulse:  88    Resp:  14    Temp: 97.7 F (36.5 C)  98 F (36.7 C) 97.6 F (36.4 C)  TempSrc: Oral  Oral Oral  SpO2:  94%    Weight:      Height:        Wt Readings from Last 3 Encounters:  01/21/18 86.6 kg     Intake/Output Summary (Last 24 hours) at 01/24/2018 1731 Last data filed at 01/24/2018 0930 Gross per 24 hour  Intake 815 ml  Output -  Net 815 ml     Physical Exam   Gen:- Awake Alert,  In no apparent distress  HEENT:- [place].AT, No sclera icterus Neck-Supple Neck,No JVD,.  Lungs-  CTAB , good air movement CV- S1, S2 normal Abd-  +ve B.Sounds, Abd Soft, No tenderness,    Extremity/Skin:- No  edema,   warm and dry Psych-cognitive deficits noted , occasional episodes of agitation neuro-no new focal deficits, no tremors   Data Review:   Micro Results Recent Results (from the past 240 hour(s))  Urine culture     Status: Abnormal   Collection Time: 01/22/18  1:05 AM  Result Value Ref Range Status   Specimen Description URINE, RANDOM  Final    Special Requests   Final    NONE Performed at [facility], [place] [address], [place], [place] [address]    Culture (A)  Final    >=100,000 COLONIES/mL KLEBSIELLA PNEUMONIAE >=100,000 COLONIES/mL ESCHERICHIA COLI    Report Status 01/24/2018 FINAL  Final   Organism ID, Bacteria KLEBSIELLA PNEUMONIAE (A)  Final   Organism ID, Bacteria ESCHERICHIA COLI (A)  Final      Susceptibility   Escherichia coli -  MIC*    AMPICILLIN >=32 RESISTANT Resistant     CEFAZOLIN <=4 SENSITIVE Sensitive     CEFTRIAXONE <=1 SENSITIVE Sensitive     CIPROFLOXACIN 0.5 SENSITIVE Sensitive     GENTAMICIN <=1 SENSITIVE Sensitive     IMIPENEM <=0.25 SENSITIVE Sensitive     NITROFURANTOIN <=16 SENSITIVE Sensitive     TRIMETH/SULFA <=20 SENSITIVE Sensitive     AMPICILLIN/SULBACTAM 16 INTERMEDIATE Intermediate     PIP/TAZO <=4 SENSITIVE Sensitive     Extended ESBL NEGATIVE Sensitive     * >=100,000 COLONIES/mL ESCHERICHIA COLI   Klebsiella pneumoniae - MIC*    AMPICILLIN >=32 RESISTANT Resistant     CEFAZOLIN <=4 SENSITIVE Sensitive     CEFTRIAXONE <=1 SENSITIVE Sensitive     CIPROFLOXACIN <=0.25 SENSITIVE Sensitive     GENTAMICIN <=1 SENSITIVE Sensitive     IMIPENEM <=0.25 SENSITIVE Sensitive     NITROFURANTOIN 64 INTERMEDIATE Intermediate     TRIMETH/SULFA <=20 SENSITIVE Sensitive     AMPICILLIN/SULBACTAM >=32 RESISTANT Resistant     PIP/TAZO 32 INTERMEDIATE Intermediate     Extended ESBL NEGATIVE Sensitive     * >=100,000 COLONIES/mL KLEBSIELLA PNEUMONIAE  MRSA PCR Screening     Status: None   Collection Time: 01/22/18  6:15 AM  Result Value Ref Range Status   MRSA by PCR NEGATIVE NEGATIVE Final    Comment:        The GeneXpert MRSA Assay (FDA approved for NASAL specimens only), is one component of a comprehensive MRSA colonization surveillance program. It is not intended to diagnose MRSA infection nor to guide or monitor treatment for MRSA infections. Performed at [facility],  [place] [address], [place], [place] [address]     Radiology Reports No results found.   CBC Recent Labs  Lab 01/21/18 2346 01/23/18 0338 01/24/18 0511  WBC 5.9 7.7 6.4  HGB 12.2* 11.8* 11.8*  HCT 38.1* 37.1* 36.9*  PLT 131* 109* 97*  MCV 106.7* 107.8* 106.6*  MCH 34.2* 34.3* 34.1*  MCHC 32.0 31.8 32.0  RDW 12.9 12.8 12.7  LYMPHSABS 1.0  --   --   MONOABS 0.4  --   --   EOSABS 0.0  --   --   BASOSABS 0.0  --   --     Chemistries  Recent Labs  Lab 01/22/18 0358 01/22/18 1008 01/22/18 1249 01/23/18 0338 01/24/18 0511  NA 140 150* 147* 139 135  K 4.5 3.6 4.1 5.1 5.6*  CL 103 111 108 106 101  CO2 26 24 28 24 24   GLUCOSE 432* 60* 89 316* 441*  BUN 39* 33* 33* 26* 24*  CREATININE 1.67* 1.51* 1.53* 1.35* 1.31*  CALCIUM 8.6* 9.0 8.9 8.2* 8.2*  MG  --   --   --  1.9 2.0  AST  --   --   --  12* 15  ALT  --   --   --  12 9  ALKPHOS  --   --   --  45 64  BILITOT  --   --   --  0.7 0.5   ------------------------------------------------------------------------------------------------------------------ No results for input(s): CHOL, HDL, LDLCALC, TRIG, CHOLHDL, LDLDIRECT in the last 72 hours.  Lab Results  Component Value Date   HGBA1C 7.9 (H) 01/22/2018   ------------------------------------------------------------------------------------------------------------------ No results for input(s): TSH, T4TOTAL, T3FREE, THYROIDAB in the last 72 hours.  Invalid input(s): FREET3 ------------------------------------------------------------------------------------------------------------------ No results for input(s): VITAMINB12, FOLATE, FERRITIN, TIBC, IRON, RETICCTPCT in the last 72 hours.  Coagulation  profile No results for input(s): INR, PROTIME in the last 168 hours.  No results for input(s): DDIMER in the last 72 hours.  Cardiac Enzymes No results for input(s): CKMB, TROPONINI, MYOGLOBIN in the last 168 hours.  Invalid input(s):  CK ------------------------------------------------------------------------------------------------------------------ No results found for: BNP   Roxan Hockey M.D on 01/24/2018 at 5:31 PM  Pager---[phone] Go to [email or web address] - password [email or web address] for contact info  [facility] - Office  [phone]

## 2018-01-25 LAB — BASIC METABOLIC PANEL
ANION GAP: 7 (ref 5–15)
BUN: 18 mg/dL (ref 8–23)
CALCIUM: 8.7 mg/dL — AB (ref 8.9–10.3)
CO2: 27 mmol/L (ref 22–32)
Chloride: 107 mmol/L (ref 98–111)
Creatinine, Ser: 1.04 mg/dL (ref 0.61–1.24)
GFR calc Af Amer: >60 mL/min (ref >60)
Glucose, Bld: 101 mg/dL — ABNORMAL HIGH (ref 70–99)
Potassium: 3.9 mmol/L (ref 3.5–5.1)
Sodium: 141 mmol/L (ref 135–145)

## 2018-01-25 LAB — GLUCOSE, CAPILLARY
GLUCOSE-CAPILLARY: 62 mg/dL — AB (ref 70–99)
GLUCOSE-CAPILLARY: 140 mg/dL — AB (ref 70–99)
GLUCOSE-CAPILLARY: 305 mg/dL — AB (ref 70–99)
GLUCOSE-CAPILLARY: 222 mg/dL — AB (ref 70–99)
Glucose-Capillary: 140 mg/dL — ABNORMAL HIGH (ref 70–99)

## 2018-01-25 MED ORDER — HALOPERIDOL LACTATE 5 MG/ML IJ SOLN: 5.0000 mg | Freq: Once | INTRAMUSCULAR | Status: DC

## 2018-01-25 MED ORDER — INSULIN ASPART 100 UNIT/ML ~~LOC~~ SOLN
0.0000 [IU] | Freq: Every day | SUBCUTANEOUS | Status: DC
  Administered 2018-01-26: 3 [IU] via SUBCUTANEOUS

## 2018-01-25 MED ORDER — INSULIN ASPART 100 UNIT/ML ~~LOC~~ SOLN
0.0000 [IU] | Freq: Three times a day (TID) | SUBCUTANEOUS | Status: DC
  Administered 2018-01-25: 7 [IU] via SUBCUTANEOUS
  Administered 2018-01-26 – 2018-01-27 (×3): 3 [IU] via SUBCUTANEOUS
  Administered 2018-01-28: 11 [IU] via SUBCUTANEOUS
  Administered 2018-01-28: 4 [IU] via SUBCUTANEOUS
  Administered 2018-01-29: 7 [IU] via SUBCUTANEOUS

## 2018-01-25 MED ORDER — INSULIN ASPART 100 UNIT/ML ~~LOC~~ SOLN
3.0000 [IU] | Freq: Three times a day (TID) | SUBCUTANEOUS | Status: DC
  Administered 2018-01-25 – 2018-01-27 (×7): 3 [IU] via SUBCUTANEOUS
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
## 2018-01-25 NOTE — Progress Notes (Signed)
Pt agitated, confused, combative, verbaly and physicly abusive to staff. He is trying to leave hospital, refused his medications. IV Ativan given at 2137-no improvement. Very pour safety awarnes.  MD notified

## 2018-01-25 NOTE — Progress Notes (Addendum)
Patient Demographics:    Andrew Burton, is a 72 y.o. male, DOB - 1945/11/06, FFM:[id number]  Admit date - 01/21/2018   Admitting Physician Vianne Bulls, MD  Outpatient Primary MD for the patient is Patient, No Pcp Per  LOS - 3   Chief Complaint  Patient presents with  . Hyperglycemia  . Altered Mental Status        Subjective:    Andrew Burton today continues to be confused and occasionally agitated, requiring restraints unfortunately, no fevers  Assessment  & Plan :    Principal Problem:   Uncontrolled type 2 DM with hyperosmolar nonketotic hyperglycemia (HCC) Active Problems:   Renal insufficiency   Hyperkalemia   Acute lower UTI   Insulin-requiring or dependent type II diabetes mellitus (HCC)   Cerebral amyloid angiopathy (HCC)   Restlessness and agitation   Essential hypertension   Acute cystitis without hematuria  Brief  Summary 72 y.o. 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 admitted on 01/22/2018 with agitation and confusion and hyperglycemia   Plan:- 1)DM2-improving control with changes to insulin regimen, hyperglycemia secondary to steroids, c/n Lantus insulin to 20 units twice daily, c/n sliding scale,  last A1c 7.9,   2)Sz Do--- history of seizures in the setting of alcohol withdrawal, okay to continue lacosamide 100 mg bid and Depakote 750 mg twice daily  3)HTN--stable, continue clonidine 0.1 mg twice daily and Coreg 12.5 mg twice daily  4)Cerebral amyloid angiopathy - Patient reportedly had brain biopsy during recent hospitalization in [place] and was diagnosed with cerebral amyloid angiitis  - He is being managed  with prednisone and was referred to vascular neurologist at [facility] (Dr [name], [phone]) with plans to start treatment with chemotherapeutic next month per family , next appointment at [place] is 01/31/2018 patient apparently scheduled to get cyclophosphamide on 02/02/2018,  Continue prednisone 10 mg twice daily,    5) Klebsiella and E. coli UTI--clinically improving, treated with IV Rocephin, c/n  Keflex 500 twice daily pain sensitivity report   6)AKI----acute kidney injury , improving,   creatinine on admission= 1.85 ,   baseline creatinine = unknown   , creatinine is now= 1.1     , renally adjust medications, avoid nephrotoxic agents/dehydration/hypotension  7)Dementia with behavioral disturbance/cognitive deficits----patient remains agitated, confused, not cooperative, requiring PRN sedatives and restraints, Zyprexa was increased to 10 mg nightly, may use additional PRN Zyprexa, continue Depakote 750 twice daily, check Depakote levels, Psych Consult requested- due to  Dementia with Behavioral disturbance --- ?????? if pt has capacity to make decisions  8)Social--plan of care discussed with patient's daughter [name], questions answered  Code Status : Full code  Disposition/Need for in-Hospital Stay- patient unable to be discharged at this time due to   ongoing agitation requiring sedative medication and physical restraints, liver problems may be related to cerebral amyloid angiopathy superimposed on underlying dementia with resolving UTI   Disposition Plan  :  ?? SNF....  DVT Prophylaxis  :   SCDs   Lab Results  Component Value Date   PLT  97 (L) 01/24/2018    Inpatient Medications  Scheduled Meds: . atorvastatin  20 mg Oral QHS  . atovaquone  1,500 mg Oral Daily  . carvedilol  12.5 mg Oral BID WC  . cephALEXin  500 mg Oral Q12H  . cholecalciferol  1,000 Units Oral QODAY  . cloNIDine  0.1 mg Oral BID  . divalproex  750 mg Oral Q12H  . famotidine  20 mg Oral BID  . feeding  supplement (ENSURE ENLIVE)  237 mL Oral BID BM  . folic acid  1 mg Oral Daily  . haloperidol lactate  2.5 mg Intravenous Once  . insulin aspart  0-20 Units Subcutaneous TID WC  . insulin aspart  0-5 Units Subcutaneous QHS  . insulin aspart  3 Units Subcutaneous TID WC  . insulin glargine  20 Units Subcutaneous BID  . lacosamide  100 mg Oral BID  . OLANZapine  10 mg Oral QHS  . predniSONE  10 mg Oral BID WC  . senna  17.2 mg Oral QHS   Continuous Infusions:  PRN Meds:.LORazepam, OLANZapine    Anti-infectives (From admission, onward)   Start     Dose/Rate Route Frequency Ordered Stop   01/25/18 1000  cephALEXin (KEFLEX) capsule 500 mg     500 mg Oral Every 12 hours 01/24/18 1032 01/29/18 0959   01/23/18 0200  cefTRIAXone (ROCEPHIN) 1 g in sodium chloride 0.9 % 100 mL IVPB  Status:  Discontinued     1 g 200 mL/hr over 30 Minutes Intravenous Every 24 hours 01/22/18 0246 01/24/18 1032   01/22/18 1000  atovaquone (MEPRON) 750 MG/5ML suspension 1,500 mg     1,500 mg Oral Daily 01/22/18 0246     01/22/18 0215  cefTRIAXone (ROCEPHIN) 1 g in sodium chloride 0.9 % 100 mL IVPB     1 g 200 mL/hr over 30 Minutes Intravenous  Once 01/22/18 0207 01/22/18 0300        Objective:   Vitals:   01/25/18 0510 01/25/18 0823 01/25/18 0900 01/25/18 1218  BP: 107/71  123/81   Pulse:   85   Resp: 12     Temp:  98.4 F (36.9 C)  98 F (36.7 C)  TempSrc:  Oral  Oral  SpO2:      Weight:      Height:        Wt Readings from Last 3 Encounters:  01/21/18 86.6 kg     Intake/Output Summary (Last 24 hours) at 01/25/2018 1605 Last data filed at 01/25/2018 1025 Gross per 24 hour  Intake 440 ml  Output 800 ml  Net -360 ml     Physical Exam  Gen:- Awake Alert,  In no apparent distress  HEENT:- [place].AT, No sclera icterus Neck-Supple Neck,No JVD,.  Lungs-  CTAB , good air movement CV- S1, S2 normal Abd-  +ve B.Sounds, Abd Soft, No tenderness,    Extremity/Skin:- No  edema,   warm and  dry Psych-cognitive deficits noted , occasional episodes of agitation and restlessness Neuro-generalized weakness and mobility related deficits , no new focal deficits, no tremors   Data Review:   Micro Results Recent Results (from the past 240 hour(s))  Urine culture     Status: Abnormal   Collection Time: 01/22/18  1:05 AM  Result Value Ref Range Status   Specimen Description URINE, RANDOM  Final   Special Requests   Final    NONE Performed at [facility], [place] [address] [place], [place] [address]  Culture (A)  Final    >=100,000 COLONIES/mL KLEBSIELLA PNEUMONIAE >=100,000 COLONIES/mL ESCHERICHIA COLI    Report Status 01/24/2018 FINAL  Final   Organism ID, Bacteria KLEBSIELLA PNEUMONIAE (A)  Final   Organism ID, Bacteria ESCHERICHIA COLI (A)  Final      Susceptibility   Escherichia coli - MIC*    AMPICILLIN >=32 RESISTANT Resistant     CEFAZOLIN <=4 SENSITIVE Sensitive     CEFTRIAXONE <=1 SENSITIVE Sensitive     CIPROFLOXACIN 0.5 SENSITIVE Sensitive     GENTAMICIN <=1 SENSITIVE Sensitive     IMIPENEM <=0.25 SENSITIVE Sensitive     NITROFURANTOIN <=16 SENSITIVE Sensitive     TRIMETH/SULFA <=20 SENSITIVE Sensitive     AMPICILLIN/SULBACTAM 16 INTERMEDIATE Intermediate     PIP/TAZO <=4 SENSITIVE Sensitive     Extended ESBL NEGATIVE Sensitive     * >=100,000 COLONIES/mL ESCHERICHIA COLI   Klebsiella pneumoniae - MIC*    AMPICILLIN >=32 RESISTANT Resistant     CEFAZOLIN <=4 SENSITIVE Sensitive     CEFTRIAXONE <=1 SENSITIVE Sensitive     CIPROFLOXACIN <=0.25 SENSITIVE Sensitive     GENTAMICIN <=1 SENSITIVE Sensitive     IMIPENEM <=0.25 SENSITIVE Sensitive     NITROFURANTOIN 64 INTERMEDIATE Intermediate     TRIMETH/SULFA <=20 SENSITIVE Sensitive     AMPICILLIN/SULBACTAM >=32 RESISTANT Resistant     PIP/TAZO 32 INTERMEDIATE Intermediate     Extended ESBL NEGATIVE Sensitive     * >=100,000 COLONIES/mL KLEBSIELLA PNEUMONIAE  MRSA PCR Screening     Status: None    Collection Time: 01/22/18  6:15 AM  Result Value Ref Range Status   MRSA by PCR NEGATIVE NEGATIVE Final    Comment:        The GeneXpert MRSA Assay (FDA approved for NASAL specimens only), is one component of a comprehensive MRSA colonization surveillance program. It is not intended to diagnose MRSA infection nor to guide or monitor treatment for MRSA infections. Performed at [facility], [place] [address], [place], [place] [address]     Radiology Reports No results found.   CBC Recent Labs  Lab 01/21/18 2346 01/23/18 0338 01/24/18 0511  WBC 5.9 7.7 6.4  HGB 12.2* 11.8* 11.8*  HCT 38.1* 37.1* 36.9*  PLT 131* 109* 97*  MCV 106.7* 107.8* 106.6*  MCH 34.2* 34.3* 34.1*  MCHC 32.0 31.8 32.0  RDW 12.9 12.8 12.7  LYMPHSABS 1.0  --   --   MONOABS 0.4  --   --   EOSABS 0.0  --   --   BASOSABS 0.0  --   --    Chemistries  Recent Labs  Lab 01/22/18 1008 01/22/18 1249 01/23/18 0338 01/24/18 0511 01/25/18 0252  NA 150* 147* 139 135 141  K 3.6 4.1 5.1 5.6* 3.9  CL 111 108 106 101 107  CO2 24 28 24 24 27   GLUCOSE 60* 89 316* 441* 101*  BUN 33* 33* 26* 24* 18  CREATININE 1.51* 1.53* 1.35* 1.31* 1.04  CALCIUM 9.0 8.9 8.2* 8.2* 8.7*  MG  --   --  1.9 2.0  --   AST  --   --  12* 15  --   ALT  --   --  12 9  --   ALKPHOS  --   --  45 64  --   BILITOT  --   --  0.7 0.5  --    ------------------------------------------------------------------------------------------------------------------ No results for input(s): CHOL, HDL, LDLCALC, TRIG, CHOLHDL, LDLDIRECT in the  last 72 hours.  Lab Results  Component Value Date   HGBA1C 7.9 (H) 01/22/2018   ------------------------------------------------------------------------------------------------------------------ No results for input(s): TSH, T4TOTAL, T3FREE, THYROIDAB in the last 72 hours.  Invalid input(s):  FREET3 ------------------------------------------------------------------------------------------------------------------ No results for input(s): VITAMINB12, FOLATE, FERRITIN, TIBC, IRON, RETICCTPCT in the last 72 hours.  Coagulation profile No results for input(s): INR, PROTIME in the last 168 hours.  No results for input(s): DDIMER in the last 72 hours.  Cardiac Enzymes No results for input(s): CKMB, TROPONINI, MYOGLOBIN in the last 168 hours.  Invalid input(s): CK ------------------------------------------------------------------------------------------------------------------ No results found for: BNP  Roxan Hockey M.D on 01/25/2018 at 4:05 PM  Pager---[phone] Go to [email or web address] - password [email or web address] for contact info  [facility] - Office  [phone]

## 2018-01-25 NOTE — Progress Notes (Signed)
CSW spoke with patient's daughter regarding discharge plan. She states she will contact CSW after she gets off of work at 1:30pm.  Cedric Fishman LCSW [phone]

## 2018-01-26 DIAGNOSIS — R4182 Altered mental status, unspecified: Secondary | ICD-10-CM

## 2018-01-26 DIAGNOSIS — F17210 Nicotine dependence, cigarettes, uncomplicated: Secondary | ICD-10-CM

## 2018-01-26 LAB — GLUCOSE, CAPILLARY
GLUCOSE-CAPILLARY: 143 mg/dL — AB (ref 70–99)
Glucose-Capillary: 70 mg/dL (ref 70–99)
Glucose-Capillary: 127 mg/dL — ABNORMAL HIGH (ref 70–99)
Glucose-Capillary: 274 mg/dL — ABNORMAL HIGH (ref 70–99)

## 2018-01-26 MED ORDER — HALOPERIDOL LACTATE 5 MG/ML IJ SOLN
5.0000 mg | Freq: Once | INTRAMUSCULAR | Status: AC
  Administered 2018-01-26: 5 mg via INTRAVENOUS
  Filled 2018-01-26: qty 1

## 2018-01-26 MED ORDER — LORAZEPAM 2 MG/ML IJ SOLN: 2.0000 mg | Freq: Once | INTRAMUSCULAR | Status: DC

## 2018-01-26 NOTE — Consult Note (Addendum)
NEURO HOSPITALIST CONSULT NOTE   Requestig physician: Dr. Denton Brick   Reason for Consult: increased confusion, agitation, hyperglycemia r/t dx of cerebral amyloid angiitis   History obtained from:  Chart  HPI:                                                                                                                                          Andrew Burton is an 72 y.o. male with PMH of HTN, seizure disorder, DM, ETOH abuse in remission who presented to [facility] for increased confusion and agitation.  Per daughter: Spoke with Andrew Burton daughter Andrew Burton) she stated that back in May patient was admitted to [facility] in [place] and was then transferred to [facility] where he remained for 2.5 months. He was originally taken to the hospital for "manic like episode" where he was confused and tore up his basement apartment. Disconnected the hot water heater.  At [place] a brain biopsy was done and he was found to have Cerebral amyloid angiitis. He was started on cyclophosphamide and prednisone then discharged to an acute care rehabilitation facility. He remained at the rehab one week before he became irritated and upset with his care. Andrew Burton then moved him down to [place] to live with her and her family. Per Andrew Burton via Andrew Burton's ex- wife patient has had moments of " not acting right" for awhile, but people contributed his behavior to his cocaine and alcohol abuse. Over the course of this 2 weeks he has been increasingly confused and agitated. He was admitted to [facility] hospital on 01/21/18 for UTI, increased confusion/ agitation and hyperglycemia. Patient receives his cyclophosphamide monthly next dose due 02/02/2018 and he missed his first neurology appointment in [place] d/t being hospitalized. Daughter is concerned that he will not receive his cyclophosphamide and his condition will worsen.    No imaging done at [facility]. U/A +, UCX: resulted E. Coli and Klebsiella ( treated with Rocephin  IV and keflex) BG > 600 on admission.  Per [facility] Discharge summary:  PET CT brain 11/23/17: Decreased metabolic activity in right parietal and right temporal lobes corresponding with signal abnormality on MRI.  Correlate clinically.  MRI brain without contrast 11/23/2017: 1.  Abbreviated examination due to patient inability to tolerate a Foley diagnostic study. 2.  Confluent white matter T2 hyperintensity within the right parietal and temporal lobe with associated mass-effect is decreased as compared to 11/05/2017.  Space given the presence of multifocal susceptibility related signal loss and leptomeningeal enhancement on 11/05/2017, the differential includes response to steroid treatment patient with cerebral amyloid angiopathy related inflammation, however other etiologies including primary arteritis of the CNS are also consideration.  Repeat contrast enhanced examination may be pursued, when patient is able.  CT head without contrast 11/25/2017  Motion degraded exam demonstrating no acute hemorrhage, herniation or hydrocephalus.  Areas of low attenuation correspond to areas of T2 hyperintense signal on recent brain MRI, not as well characterized on noncontrast CT.  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  The absence of more characteristic histological findings of ABRA, such as Angiovist or active granulomatosis inflammation, may potentially be attributable to sampling error.  Other potential explanations for the reactive changes seen, such as an encephalitic or neoplastic process, would not supported by the immunohistochemical findings.  MRI brain with and without IV contrast 12/23/2017: 1.  Status post right temporal craniotomy and temporal corticectomy with expected  postprocedural changes. 2.  Restricted diffusion in the left periatrial white matter with associated T2 hyperintensity and enhancement, compatible with subacute infarction. 3.  Since 11/05/2017, marked interval reduction in the leptomeningeal enhancement with small foci of residual enhancement in the right posterior temporal lobe and right parietal lobe.  Marked interval reduction in the extent of T2 hyperintensity in the right temporal and parietal lobes and decreased in the associated local mass affect/sulcal effacement, favored to represent response to treatment in the setting of amyloid beta related angiitis.  Unchanged additional patchy confluent white matter T2 hyperintensities and multifocal chronic microhemorrhage.  Past Medical History:  Diagnosis Date  . Hypertension     History reviewed. No pertinent surgical history.  Family History  Problem Relation Age of Onset  . Obesity Other          Social History:  reports that he has been smoking cigarettes, pipe, cigars, and e-cigarettes. He does not have any smokeless tobacco history on file. He reports that he drank alcohol. He reports that . Drug: Cocaine.  No Known Allergies  MEDICATIONS:                                                                                                                     Scheduled: . atorvastatin  20 mg Oral QHS  . atovaquone  1,500 mg Oral Daily  . carvedilol  12.5 mg Oral BID WC  . cephALEXin  500 mg Oral Q12H  . cholecalciferol  1,000 Units Oral QODAY  . cloNIDine  0.1 mg Oral BID  . divalproex  750 mg Oral Q12H  . famotidine  20 mg Oral BID  . feeding supplement (ENSURE ENLIVE)  237 mL Oral BID BM  . folic acid  1 mg Oral Daily  . haloperidol lactate  5 mg Intravenous Once  . insulin aspart  0-20 Units Subcutaneous TID WC  . insulin aspart  0-5 Units Subcutaneous QHS  . insulin aspart  3 Units Subcutaneous TID WC  . insulin glargine  20 Units Subcutaneous BID  . lacosamide  100 mg Oral  BID  . OLANZapine  10 mg Oral QHS  . predniSONE  10  mg Oral BID WC  . senna  17.2 mg Oral QHS   Continuous:  [id number], OLANZapine   ROS:                                                                                                                                       History  unobtainable from patient due to mental status   Blood pressure (!) 141/72, pulse 66, temperature 98.4 F (36.9 C), temperature source Oral, resp. rate 13, height 5\' 7"  (1.702 m), weight 86.6 kg, SpO2 99 %.   General Examination:                                                                                                       Physical Exam  HEENT-  Normocephalic, no lesions, without obvious abnormality.  Normal external eye and conjunctiva.   Cardiovascular- S1-S2 audible, pulses palpable throughout   Lungs-no rhonchi or wheezing noted, no excessive working breathing.  Saturations within normal limits on RA Extremities- Warm, dry and intact Musculoskeletal-no joint tenderness, deformity or swelling Skin-warm and dry, no hyperpigmentation, vitiligo, or suspicious lesions  Neurological Examination Mental Status: Alert, patient is very confused and hallucinating. He said the year was 45 and he was 50 years old. He saw cat's on the bed in which he tried to feed. He also saw a woman that he did not know sitting on the end of the bed. Patient is sometimes inappropriate with staff asking for kisses. Patient is in 4 soft limb restraints. Patient will follow some simple commands when he wants to participate Cranial Nerves: : able to track examiner around the bed.  ptosis not present, extra-ocular motions intact bilaterally pupils equal, round, reactive to light and accommodation, smile symmetric, facial light touch sensation normal bilaterally hearing normal bilaterally, uvula rises symmetrically  Motor/ Sensory:  Right : Upper extremity   5/5    Left:     Upper extremity   5/5  Lower extremity    5/5     Lower extremity   5/5 Moves all 4 extremities spontaneously. Able to localize and withdraw from painful stimulation.  Deep Tendon Reflexes: 2+ and biceps and patella Plantars: Right: downgoing   Left: downgoing Cerebellar: UTA d/t limb restraints and my safety Gait: deferred   Lab Results: Basic Metabolic Panel: Recent Labs  Lab 01/22/18 1008 01/22/18 1249 01/23/18 0338 01/24/18 0511 01/25/18 0252  NA 150* 147* 139 135 141  K 3.6 4.1 5.1 5.6* 3.9  CL 111  108 106 101 107  CO2 24 28 24 24 27   GLUCOSE 60* 89 316* 441* 101*  BUN 33* 33* 26* 24* 18  CREATININE 1.51* 1.53* 1.35* 1.31* 1.04  CALCIUM 9.0 8.9 8.2* 8.2* 8.7*  MG  --   --  1.9 2.0  --     CBC: Recent Labs  Lab 01/21/18 2346 01/23/18 0338 01/24/18 0511  WBC 5.9 7.7 6.4  NEUTROABS 4.3  --   --   HGB 12.2* 11.8* 11.8*  HCT 38.1* 37.1* 36.9*  MCV 106.7* 107.8* 106.6*  PLT 131* 109* 97*    Cardiac Enzymes: No results for input(s): CKTOTAL, CKMB, CKMBINDEX, TROPONINI in the last 168 hours.  Lipid Panel: No results for input(s): CHOL, TRIG, HDL, CHOLHDL, VLDL, LDLCALC in the last 168 hours.  Imaging: No results found.   Recommendations:   Laurey Morale, MSN, NP-C [facility] [phone]   01/26/2018, 10:35 AM   NEUROHOSPITALIST ADDENDUM Performed a face to face diagnostic evaluation.   I have reviewed the contents of history and physical exam as documented by PA/ARNP/Resident and agree with above documentation.  I have discussed this case my impression is as below.      Impression:  72 year old AA male with PMH of HTN, seizure disorder, cerebral amyloid B related angiitis, DM, ETOH abuse in remission who presented to [facility] for increased confusion and agitation.  Noted to be hyperglycemic in the 800s as well has had a urinary tract infection.  Steroid dose reduced from 10 mill grams 3 times daily to 10 mg twice daily by primary team. Neurology consulted as daughter wanted to  resume cyclophosphamide during patient's admission at [facility].  I have reviewed the MRI reports as well as brain biopsy report.  Cerebral amyloid angitis is a rare diagnosis and we would like to review MRI images.  Patient also has long-standing history of alcohol and substance abuse screen playing a role in his dementia.  He also has diabetes is currently being treated for urinary tract infection.  While cyclophosphamide has been used in the treatment of cerebral amyloid angitis, its efficacy is unclear with no large studies showing clear benefit.  My concern is his cyclophosphamide may be more harmful than beneficial, and increase his susceptibility to infection.   Explained to the daughter that given the rarity of this diagnosis and requirement for aggressive therapy, it is better that he keeps his appointment with [facility] neurology.  Also explained that I cannot guarantee that they would agree with continuation of cyclophosphamide.  I discussed this case with Dr. Leonie Man, a senior vascular neurologist at [facility] the stroke team who shares my concern.  Patient's daughter  would like to talk to stroke team tomorrow to discuss options.    Karena Addison Geneveive Furness MD [facility] [phone]   If 7pm to 7am, please call on call as listed on AMION.

## 2018-01-26 NOTE — Progress Notes (Signed)
Pt became agitated at 2015, verbally and physically abusive to staff, pulling IV line and condom catheter off. Pt level of care is stepdown, needs continuous monitoring, he is constantly pulling tele leads off and continuous pulse ox.  RN unable to redirect pt, severe confusion, unable to hold conversation.  RN attempted to reorient pt, provide diversional activities, decrease stimulation, limit settings. All above mentioned techniques have failed.  MD notified, order for restrainers issued and initiated. Will continue to monitor pt closely.

## 2018-01-26 NOTE — Progress Notes (Signed)
Patient Demographics:    Andrew Burton, is a 72 y.o. male, DOB - 11-23-1945, STM:[id number]  Admit date - 01/21/2018   Admitting Physician Vianne Bulls, MD  Outpatient Primary MD for the patient is Patient, No Pcp Per  LOS - 4   Chief Complaint  Patient presents with  . Hyperglycemia  . Altered Mental Status        Subjective:    Andrew Burton today episodes of agitation confusion from time to time, no fevers  Assessment  & Plan :    Principal Problem:   Uncontrolled type 2 DM with hyperosmolar nonketotic hyperglycemia (HCC) Active Problems:   Renal insufficiency   Hyperkalemia   Acute lower UTI   Insulin-requiring or dependent type II diabetes mellitus (HCC)   Cerebral amyloid angiopathy (HCC)   Restlessness and agitation   Essential hypertension   Acute cystitis without hematuria  Brief  Summary 72 y.o. 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 admitted on 01/22/2018 with agitation and confusion and hyperglycemia   Plan:- 1)DM2-improving control with changes to insulin regimen, hyperglycemia secondary to steroids, c/n Lantus insulin to 20 units twice daily, c/n sliding scale,  last A1c 7.9,    2)Sz Do--- history of seizures in the setting of alcohol withdrawal, okay to continue lacosamide 100 mg bid and Depakote 750 mg twice daily  3)HTN--stable, continue clonidine 0.1 mg twice daily and Coreg 12.5 mg twice daily  4)Cerebral amyloid angiopathy - Patient reportedly had brain biopsy during recent hospitalization in [place] and was diagnosed with cerebral amyloid angiitis  - He is being managed with prednisone and was referred to  vascular neurologist at [facility] (Dr [name], [phone]) with plans to start treatment with chemotherapeutic next month per family , next appointment at [place] is 01/31/2018 patient apparently scheduled to get cyclophosphamide on 02/02/2018,  Continue prednisone 10 mg twice daily,    5) Klebsiella and E. coli UTI--clinically improving, treated with IV Rocephin, c/n  Keflex 500 twice daily per sensitivity report   6)AKI----acute kidney injury , improving,   creatinine on admission= 1.85 ,   baseline creatinine = unknown   , creatinine is now= 1.1     , renally adjust medications, avoid nephrotoxic agents/dehydration/hypotension  7)Dementia with behavioral disturbance/cognitive deficits----patient remains agitated, confused, not cooperative, requiring PRN sedatives and restraints, Zyprexa was increased to 10 mg nightly, may use additional PRN Zyprexa, continue Depakote 750 twice daily, check Depakote levels, Psych Consult appreciated, as per psychiatrist consult from 01/26/2018 patient does not have capacity to make informed decisions  8)Social--plan of care discussed with patient's daughter [name], questions answered  Code Status : Full code  Disposition/Need for in-Hospital Stay- patient unable to be discharged at this time due to   ongoing agitation requiring sedative medication and physical restraints, behavioral problems may be related to cerebral amyloid angiopathy superimposed on underlying dementia with resolving UTI.  Patient will need placement to facility but his behavioral issues need to be better controlled before skilled nursing facility will accept him.  Neurology and psychiatry consultation dated 01/26/2018  appreciated   Disposition Plan  :  ?? SNF....  DVT Prophylaxis  :   SCDs   Lab Results  Component Value Date   PLT 97 (L) 01/24/2018    Inpatient Medications  Scheduled Meds: . atorvastatin  20 mg Oral QHS  . atovaquone  1,500 mg Oral Daily  . carvedilol  12.5 mg Oral BID WC    . cephALEXin  500 mg Oral Q12H  . cholecalciferol  1,000 Units Oral QODAY  . cloNIDine  0.1 mg Oral BID  . divalproex  750 mg Oral Q12H  . famotidine  20 mg Oral BID  . feeding supplement (ENSURE ENLIVE)  237 mL Oral BID BM  . folic acid  1 mg Oral Daily  . haloperidol lactate  5 mg Intravenous Once  . insulin aspart  0-20 Units Subcutaneous TID WC  . insulin aspart  0-5 Units Subcutaneous QHS  . insulin aspart  3 Units Subcutaneous TID WC  . insulin glargine  20 Units Subcutaneous BID  . lacosamide  100 mg Oral BID  . OLANZapine  10 mg Oral QHS  . predniSONE  10 mg Oral BID WC  . senna  17.2 mg Oral QHS   Continuous Infusions:  PRN Meds:.LORazepam, OLANZapine    Anti-infectives (From admission, onward)   Start     Dose/Rate Route Frequency Ordered Stop   01/25/18 1000  cephALEXin (KEFLEX) capsule 500 mg     500 mg Oral Every 12 hours 01/24/18 1032 01/29/18 0959   01/23/18 0200  cefTRIAXone (ROCEPHIN) 1 g in sodium chloride 0.9 % 100 mL IVPB  Status:  Discontinued     1 g 200 mL/hr over 30 Minutes Intravenous Every 24 hours 01/22/18 0246 01/24/18 1032   01/22/18 1000  atovaquone (MEPRON) 750 MG/5ML suspension 1,500 mg     1,500 mg Oral Daily 01/22/18 0246     01/22/18 0215  cefTRIAXone (ROCEPHIN) 1 g in sodium chloride 0.9 % 100 mL IVPB     1 g 200 mL/hr over 30 Minutes Intravenous  Once 01/22/18 0207 01/22/18 0300        Objective:   Vitals:   01/25/18 1800 01/25/18 2110 01/26/18 0310 01/26/18 0927  BP: 117/73 135/73 134/82 (!) 141/72  Pulse: 73  66   Resp:   13   Temp:   98.4 F (36.9 C)   TempSrc:   Oral   SpO2:   99%   Weight:      Height:        Wt Readings from Last 3 Encounters:  01/21/18 86.6 kg     Intake/Output Summary (Last 24 hours) at 01/26/2018 1918 Last data filed at 01/26/2018 1856 Gross per 24 hour  Intake 340 ml  Output 1150 ml  Net -810 ml     Physical Exam  Gen:- Awake Alert,  In no apparent distress  HEENT:- [place].AT, No sclera  icterus Neck-Supple Neck,No JVD,.  Lungs-  CTAB , good air movement CV- S1, S2 normal Abd-  +ve B.Sounds, Abd Soft, No tenderness,    Extremity/Skin:- No  edema,   warm and dry Psych-cognitive deficits noted , occasional episodes of agitation and restlessness Neuro-generalized weakness and mobility related deficits , no new focal deficits, no tremors   Data Review:   Micro Results Recent Results (from the past 240 hour(s))  Urine culture     Status: Abnormal   Collection Time: 01/22/18  1:05 AM  Result Value Ref Range Status   Specimen Description URINE,  RANDOM  Final   Special Requests   Final    NONE Performed at [facility], [place] [address], [place], [place] [address]    Culture (A)  Final    >=100,000 COLONIES/mL KLEBSIELLA PNEUMONIAE >=100,000 COLONIES/mL ESCHERICHIA COLI    Report Status 01/24/2018 FINAL  Final   Organism ID, Bacteria KLEBSIELLA PNEUMONIAE (A)  Final   Organism ID, Bacteria ESCHERICHIA COLI (A)  Final      Susceptibility   Escherichia coli - MIC*    AMPICILLIN >=32 RESISTANT Resistant     CEFAZOLIN <=4 SENSITIVE Sensitive     CEFTRIAXONE <=1 SENSITIVE Sensitive     CIPROFLOXACIN 0.5 SENSITIVE Sensitive     GENTAMICIN <=1 SENSITIVE Sensitive     IMIPENEM <=0.25 SENSITIVE Sensitive     NITROFURANTOIN <=16 SENSITIVE Sensitive     TRIMETH/SULFA <=20 SENSITIVE Sensitive     AMPICILLIN/SULBACTAM 16 INTERMEDIATE Intermediate     PIP/TAZO <=4 SENSITIVE Sensitive     Extended ESBL NEGATIVE Sensitive     * >=100,000 COLONIES/mL ESCHERICHIA COLI   Klebsiella pneumoniae - MIC*    AMPICILLIN >=32 RESISTANT Resistant     CEFAZOLIN <=4 SENSITIVE Sensitive     CEFTRIAXONE <=1 SENSITIVE Sensitive     CIPROFLOXACIN <=0.25 SENSITIVE Sensitive     GENTAMICIN <=1 SENSITIVE Sensitive     IMIPENEM <=0.25 SENSITIVE Sensitive     NITROFURANTOIN 64 INTERMEDIATE Intermediate     TRIMETH/SULFA <=20 SENSITIVE Sensitive     AMPICILLIN/SULBACTAM >=32 RESISTANT  Resistant     PIP/TAZO 32 INTERMEDIATE Intermediate     Extended ESBL NEGATIVE Sensitive     * >=100,000 COLONIES/mL KLEBSIELLA PNEUMONIAE  MRSA PCR Screening     Status: None   Collection Time: 01/22/18  6:15 AM  Result Value Ref Range Status   MRSA by PCR NEGATIVE NEGATIVE Final    Comment:        The GeneXpert MRSA Assay (FDA approved for NASAL specimens only), is one component of a comprehensive MRSA colonization surveillance program. It is not intended to diagnose MRSA infection nor to guide or monitor treatment for MRSA infections. Performed at [facility], [place] [address] [place], [place] [address]     Radiology Reports No results found.   CBC Recent Labs  Lab 01/21/18 2346 01/23/18 0338 01/24/18 0511  WBC 5.9 7.7 6.4  HGB 12.2* 11.8* 11.8*  HCT 38.1* 37.1* 36.9*  PLT 131* 109* 97*  MCV 106.7* 107.8* 106.6*  MCH 34.2* 34.3* 34.1*  MCHC 32.0 31.8 32.0  RDW 12.9 12.8 12.7  LYMPHSABS 1.0  --   --   MONOABS 0.4  --   --   EOSABS 0.0  --   --   BASOSABS 0.0  --   --    Chemistries  Recent Labs  Lab 01/22/18 1008 01/22/18 1249 01/23/18 0338 01/24/18 0511 01/25/18 0252  NA 150* 147* 139 135 141  K 3.6 4.1 5.1 5.6* 3.9  CL 111 108 106 101 107  CO2 24 28 24 24 27   GLUCOSE 60* 89 316* 441* 101*  BUN 33* 33* 26* 24* 18  CREATININE 1.51* 1.53* 1.35* 1.31* 1.04  CALCIUM 9.0 8.9 8.2* 8.2* 8.7*  MG  --   --  1.9 2.0  --   AST  --   --  12* 15  --   ALT  --   --  12 9  --   ALKPHOS  --   --  45 64  --  BILITOT  --   --  0.7 0.5  --    ------------------------------------------------------------------------------------------------------------------ No results for input(s): CHOL, HDL, LDLCALC, TRIG, CHOLHDL, LDLDIRECT in the last 72 hours.  Lab Results  Component Value Date   HGBA1C 7.9 (H) 01/22/2018   ------------------------------------------------------------------------------------------------------------------ No results for input(s): TSH,  T4TOTAL, T3FREE, THYROIDAB in the last 72 hours.  Invalid input(s): FREET3 ------------------------------------------------------------------------------------------------------------------ No results for input(s): VITAMINB12, FOLATE, FERRITIN, TIBC, IRON, RETICCTPCT in the last 72 hours.  Coagulation profile No results for input(s): INR, PROTIME in the last 168 hours.  No results for input(s): DDIMER in the last 72 hours.  Cardiac Enzymes No results for input(s): CKMB, TROPONINI, MYOGLOBIN in the last 168 hours.  Invalid input(s): CK ------------------------------------------------------------------------------------------------------------------ No results found for: BNP  Roxan Hockey M.D on 01/26/2018 at 7:18 PM  Pager---[phone] Go to [email or web address] - password [email or web address] for contact info  [facility] - Office  [phone]

## 2018-01-26 NOTE — Progress Notes (Signed)
Order for restrains discontinued, restrainers taken off. Pt resting at this time. Confused, but not agitated nor combative. Skin on wrists, ankles, abdomen and back intact, active range of motions present in all four extremities.  Will continue to monitor pt.

## 2018-01-26 NOTE — Consult Note (Signed)
[facility] Face-to-Face Psychiatry Consult   Reason for Consult: ''Dementia with behavior disturbance. To determine if patient has capacity to make decision'' Referring Physician:  Dr. Denton Brick Patient Identification: Andrew Burton MRN:  [id number] Principal Diagnosis: Uncontrolled type 2 DM with hyperosmolar nonketotic hyperglycemia ([place]) Diagnosis:   Patient Active Problem List   Diagnosis Date Noted  . Renal insufficiency [N28.9] 01/22/2018  . Hyperkalemia [E87.5] 01/22/2018  . Acute lower UTI [N39.0] 01/22/2018  . Insulin-requiring or dependent type II diabetes mellitus ([place]) [E11.9, Z79.4] 01/22/2018  . Uncontrolled type 2 DM with hyperosmolar nonketotic hyperglycemia ([place]) [E11.00] 01/22/2018  . Cerebral amyloid angiopathy ([place]) [E85.4, I68.0] 01/22/2018  . Restlessness and agitation [R45.1] 01/22/2018  . Essential hypertension [I10] 01/22/2018  . Acute cystitis without hematuria [N30.00]     Total Time spent with patient: 30 minutes  Subjective:   Andrew Burton is a 72 y.o. male patient admitted with hyperglycemia and altered mental status.  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 He was admitted to [facility] on 01/22/2018 due to being combative, agitated, confused and hyperglycemic. Patient is alert but he remains confused, combative and unable to give a coherent history or sequence of events of what brought him to the hospital. He is not able to complete mini mental status examination. Patient has trouble retaining information, does not understand information given to him and has no insight into his current circumstance. He is not aware of the reason why he was admitted into the hospital nor is he able to communicate  what he wants to do help his current medical condition.   Past Psychiatric History: unknown  Risk to Self:   Risk to Others:   Prior Inpatient Therapy:   Prior Outpatient Therapy:    Past Medical History:  Past Medical History:  Diagnosis Date  . Hypertension    History reviewed. No pertinent surgical history. Family History:  Family History  Problem Relation Age of Onset  . Obesity Other    Family Psychiatric  History:  Social History:  Social History   Substance and Sexual Activity  Alcohol Use Not Currently   Comment: former alcoholic      Social History   Substance and Sexual Activity  Drug Use Not on file    Social History   Socioeconomic History  . Marital status: Single    Spouse name: Not on file  . Number of children: Not on file  . Years of education: Not on file  . Highest education level: Not on file  Occupational History  . Not on file  Social Needs  . Financial resource strain: Not on file  . Food insecurity:    Worry: Not on file    Inability: Not on file  . Transportation needs:    Medical: Not on file    Non-medical: Not on file  Tobacco Use  . Smoking status: Current Every Day Smoker    Types: Cigarettes, Pipe, Cigars, E-cigarettes  Substance and Sexual Activity  . Alcohol use: Not Currently    Comment: former alcoholic   . Drug use: Not on file  . Sexual activity: Not on file  Lifestyle  . Physical activity:    Days per week: Not on file    Minutes per session: Not on file  .  Stress: Not on file  Relationships  . Social connections:    Talks on phone: Not on file    Gets together: Not on file    Attends religious service: Not on file    Active member of club or organization: Not on file    Attends meetings of clubs or organizations: Not on file    Relationship status: Not on file  Other Topics Concern  . Not on file  Social History Narrative  . Not on file   Additional Social History:    Allergies:  No Known  Allergies  Labs:  Results for orders placed or performed during the hospital encounter of 01/21/18 (from the past 48 hour(s))  Glucose, capillary     Status: Abnormal   Collection Time: 01/24/18  5:24 PM  Result Value Ref Range   Glucose-Capillary 412 (H) 70 - 99 mg/dL  Glucose, capillary     Status: Abnormal   Collection Time: 01/24/18  9:23 PM  Result Value Ref Range   Glucose-Capillary 282 (H) 70 - 99 mg/dL  Basic metabolic panel     Status: Abnormal   Collection Time: 01/25/18  2:52 AM  Result Value Ref Range   Sodium 141 135 - 145 mmol/L   Potassium 3.9 3.5 - 5.1 mmol/L   Chloride 107 98 - 111 mmol/L   CO2 27 22 - 32 mmol/L   Glucose, Bld 101 (H) 70 - 99 mg/dL   BUN 18 8 - 23 mg/dL   Creatinine, Ser 1.04 0.61 - 1.24 mg/dL   Calcium 8.7 (L) 8.9 - 10.3 mg/dL   GFR calc non Af Amer >60 >60 mL/min   GFR calc Af Amer >60 >60 mL/min    Comment: (NOTE) The eGFR has been calculated using the CKD EPI equation. This calculation has not been validated in all clinical situations. eGFR's persistently <60 mL/min signify possible Chronic Kidney Disease.    Anion gap 7 5 - 15    Comment: Performed at [facility] [address], [place], [place] [address]  Glucose, capillary     Status: Abnormal   Collection Time: 01/25/18  8:16 AM  Result Value Ref Range   Glucose-Capillary 62 (L) 70 - 99 mg/dL  Glucose, capillary     Status: Abnormal   Collection Time: 01/25/18  9:38 AM  Result Value Ref Range   Glucose-Capillary 140 (H) 70 - 99 mg/dL  Glucose, capillary     Status: Abnormal   Collection Time: 01/25/18 12:11 PM  Result Value Ref Range   Glucose-Capillary 305 (H) 70 - 99 mg/dL  Glucose, capillary     Status: Abnormal   Collection Time: 01/25/18  5:03 PM  Result Value Ref Range   Glucose-Capillary 222 (H) 70 - 99 mg/dL  Glucose, capillary     Status: Abnormal   Collection Time: 01/25/18 11:03 PM  Result Value Ref Range   Glucose-Capillary 140 (H) 70 - 99 mg/dL   Glucose, capillary     Status: None   Collection Time: 01/26/18  8:10 AM  Result Value Ref Range   Glucose-Capillary 70 70 - 99 mg/dL  Glucose, capillary     Status: Abnormal   Collection Time: 01/26/18 11:59 AM  Result Value Ref Range   Glucose-Capillary 143 (H) 70 - 99 mg/dL    Current Facility-Administered Medications  Medication Dose Route Frequency Provider Last Rate Last Dose  . atorvastatin (LIPITOR) tablet 20 mg  20 mg Oral QHS Opyd, Ilene Qua, MD  20 mg at 01/25/18 2308  . atovaquone (MEPRON) 750 MG/5ML suspension 1,500 mg  1,500 mg Oral Daily Opyd, Ilene Qua, MD   1,500 mg at 01/26/18 0929  . carvedilol (COREG) tablet 12.5 mg  12.5 mg Oral BID WC Opyd, Ilene Qua, MD   12.5 mg at 01/26/18 0928  . cephALEXin (KEFLEX) capsule 500 mg  500 mg Oral Q12H Emokpae, Courage, MD   500 mg at 01/26/18 0928  . cholecalciferol (VITAMIN D) tablet 1,000 Units  1,000 Units Oral Abigail Butts, MD   1,000 Units at 01/26/18 [phone]  . cloNIDine (CATAPRES) tablet 0.1 mg  0.1 mg Oral BID Opyd, Ilene Qua, MD   0.1 mg at 01/26/18 0927  . divalproex (DEPAKOTE) DR tablet 750 mg  750 mg Oral Q12H Emokpae, Courage, MD   750 mg at 01/26/18 0927  . famotidine (PEPCID) tablet 20 mg  20 mg Oral BID Vianne Bulls, MD   20 mg at 01/26/18 8502  . feeding supplement (ENSURE ENLIVE) (ENSURE ENLIVE) liquid 237 mL  237 mL Oral BID BM Amin, Ankit Chirag, MD   237 mL at 01/26/18 1318  . folic acid (FOLVITE) tablet 1 mg  1 mg Oral Daily Opyd, Ilene Qua, MD   1 mg at 01/26/18 7741  . haloperidol lactate (HALDOL) injection 5 mg  5 mg Intravenous Once Bodenheimer, Charles A, NP      . insulin aspart (novoLOG) injection 0-20 Units  0-20 Units Subcutaneous TID WC Roxan Hockey, MD   3 Units at 01/26/18 1215  . insulin aspart (novoLOG) injection 0-5 Units  0-5 Units Subcutaneous QHS Emokpae, Courage, MD      . insulin aspart (novoLOG) injection 3 Units  3 Units Subcutaneous TID WC Roxan Hockey, MD   3 Units at  01/26/18 1216  . insulin glargine (LANTUS) injection 20 Units  20 Units Subcutaneous BID Roxan Hockey, MD   20 Units at 01/26/18 [phone]  . lacosamide (VIMPAT) tablet 100 mg  100 mg Oral BID Roxan Hockey, MD   100 mg at 01/26/18 0928  . LORazepam (ATIVAN) injection 1 mg  1 mg Intravenous Q4H PRN Roxan Hockey, MD   1 mg at 01/25/18 2137  . OLANZapine (ZYPREXA) tablet 10 mg  10 mg Oral QHS Emokpae, Courage, MD   10 mg at 01/25/18 2309  . OLANZapine (ZYPREXA) tablet 5 mg  5 mg Oral Q6H PRN Opyd, Ilene Qua, MD   5 mg at 01/25/18 1041  . predniSONE (DELTASONE) tablet 10 mg  10 mg Oral BID WC Emokpae, Courage, MD   10 mg at 01/26/18 0927  . senna (SENOKOT) tablet 17.2 mg  17.2 mg Oral QHS Opyd, Ilene Qua, MD   17.2 mg at 01/25/18 2307    Musculoskeletal: Strength & Muscle Tone: within normal limits Gait & Station: unable to stand Patient leans: N/A  Psychiatric Specialty Exam: Physical Exam  Psychiatric: His speech is normal. His affect is blunt. He is agitated, aggressive and combative. Cognition and memory are impaired. He expresses impulsivity.    Review of Systems  Unable to perform ROS: Acuity of condition    Blood pressure (!) 141/72, pulse 66, temperature 98.4 F (36.9 C), temperature source Oral, resp. rate 13, height 5' 7"  (1.702 m), weight 86.6 kg, SpO2 99 %.Body mass index is 29.91 kg/m.  General Appearance: Casual  Eye Contact:  Minimal  Speech:  Clear and Coherent  Volume:  Increased  Mood:  Irritable  Affect:  Labile  Thought  Process:  Disorganized  Orientation:  Other:  only to person, not to place or time. Patient states that he is 72 year old  Thought Content:  Illogical  Suicidal Thoughts:  denies  Homicidal Thoughts:  denies  Memory:  Immediate;   Poor Recent;   Poor Remote;   Fair  Judgement:  Poor  Insight:  poor  Psychomotor Activity:  Restlessness  Concentration:  Concentration: Poor and Attention Span: Poor  Recall:  Poor  Fund of Knowledge:   unable to assess due to being confused  Language:  Good  Akathisia:  No  Handed:  Right  AIMS (if indicated):     Assets:  Social Support  ADL's:  Impaired  Cognition:  Impaired,  Mild  Sleep:        Treatment Plan Summary: 72 y.o.malewith medical history significant foralcohol abuse in remission, seizure disorder, hypertension, diabetes mellitus, recent Brain biopsy suggestive of amyloid beta-related angitis admitted who was admitted due to altered mental status, confusion and hyperglycemia. Based on my evaluation today, patient lack capacity to make informed medical decision. He has trouble understanding his current medical condition, he is unable to retain simple information and he is too confused to communicate his needs. In addition, he currently has no ability to make inform decision based on the information present to him.  Disposition: Psychiatric service signing off. Re-consult psych as needed.    Corena Pilgrim, MD 01/26/2018 1:56 PM

## 2018-01-27 LAB — GLUCOSE, CAPILLARY
GLUCOSE-CAPILLARY: 148 mg/dL — AB (ref 70–99)
GLUCOSE-CAPILLARY: 156 mg/dL — AB (ref 70–99)
Glucose-Capillary: 82 mg/dL (ref 70–99)
Glucose-Capillary: 111 mg/dL — ABNORMAL HIGH (ref 70–99)

## 2018-01-27 MED ORDER — PREDNISONE 10 MG PO TABS
10.0000 mg | ORAL_TABLET | Freq: Every day | ORAL | Status: DC
  Administered 2018-01-28 – 2018-01-29 (×2): 10 mg via ORAL
  Filled 2018-01-27 (×2): qty 1

## 2018-01-27 MED ORDER — LORAZEPAM 2 MG/ML IJ SOLN: 1.0000 mg | Freq: Once | INTRAMUSCULAR | Status: DC

## 2018-01-27 NOTE — Progress Notes (Signed)
STROKE TEAM PROGRESS NOTE   HISTORY OF PRESENT ILLNESS (per record) Andrew Burton is an 72 y.o. male with PMH of HTN, seizure disorder, DM, ETOH abuse in remission who presented to [facility] for increased confusion and agitation.  Per daughter: Spoke with Mr. Andrew Burton daughter [name]) she stated that back in May patient was admitted to [facility] in [place] and was then transferred to [facility] where he remained for 2.5 months. He was originally taken to the hospital for "manic like episode" where he was confused and tore up his basement apartment. Disconnected the hot water heater.  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 He was started on cyclophosphamide and prednisone then discharged to an acute care rehabilitation facility. He remained at the rehab one week before he became irritated and upset with his care. [name] then moved him down to [place] to live with her and her family. Per rhonda via Mr. Orellana's ex- wife patient has had moments of " not acting right" for awhile, but people attributed his behavior to his cocaine and alcohol abuse. Over the course of this 2 weeks he has been increasingly confused and agitated. He was admitted to [facility] hospital on 01/21/18 for UTI, increased confusion/ agitation and hyperglycemia. Patient receives his cyclophosphamide monthly next dose due 02/02/2018 and he missed his first neurology appointment in [place] d/t being hospitalized. Daughter is concerned that he will not receive his cyclophosphamide and his condition will worsen.    No imaging done at [facility]. U/A +, UCX: resulted E. Coli and Klebsiella ( treated with Rocephin IV and keflex) BG > 600 on admission.  Per [facility] Discharge summary:  PET CT brain 11/23/17: Decreased metabolic activity in right parietal and right temporal lobes corresponding with signal abnormality on MRI.  Correlate clinically.   SUBJECTIVE (INTERVAL HISTORY) No family members present. The  patient remains confused and disoriented.     OBJECTIVE Vitals:   01/26/18 1910 01/26/18 2339 01/27/18 0015 01/27/18 0409  BP: (!) 146/83 (!) 121/97  134/71  Pulse:      Resp: 14 18  13   Temp:  98.2 F (36.8 C) 97.6 F (36.4 C) 97.9 F (36.6 C)  TempSrc:  Oral Axillary Axillary  SpO2:  98%    Weight:      Height:        CBC:  Recent Labs  Lab 01/21/18 2346 01/23/18 0338 01/24/18 0511  WBC 5.9 7.7 6.4  NEUTROABS 4.3  --   --   HGB 12.2* 11.8* 11.8*  HCT 38.1* 37.1* 36.9*  MCV 106.7* 107.8* 106.6*  PLT 131* 109* 97*    Basic Metabolic Panel:  Recent Labs  Lab 01/23/18 0338 01/24/18 0511 01/25/18 0252  NA 139 135 141  K 5.1 5.6* 3.9  CL 106 101 107  CO2 24 24 27   GLUCOSE 316* 441* 101*  BUN 26* 24* 18  CREATININE 1.35* 1.31* 1.04  CALCIUM 8.2* 8.2* 8.7*  MG 1.9 2.0  --     Lipid Panel: No results found for: CHOL, TRIG, HDL, CHOLHDL, VLDL, LDLCALC HgbA1c:  Lab Results  Component Value Date   HGBA1C 7.9 (H) 01/22/2018   Urine Drug Screen: No results found for: LABOPIA, COCAINSCRNUR, LABBENZ, AMPHETMU, THCU, LABBARB  Alcohol Level No results found for: ETH  IMAGING   No results found.  PET CT brain 11/23/17: OSH Decreased metabolic activity in rightparietal and right temporal lobescorresponding with signal abnormality on MRI.  PHYSICAL EXAM Blood pressure 134/71, pulse 66, temperature 97.9 F (36.6 C), temperature source Axillary, resp. rate 13, height 5\' 7"  (1.702 m), weight 86.6 kg, SpO2 98 %.  middle aged African-American male who is not in distress. . Afebrile. Head is nontraumatic. Neck is supple without bruit.    Cardiac exam no murmur or gallop. Lungs are clear to auscultation. Distal pulses are well felt.  Neurological Exam ;  Awake  Alert disoriented x 3.  Not following any commands.  Speaks only a few words in short sentences.  Decreased attention, registration and recall.  No insight into his illness.  Slow hesitant nonfluent   speech  ye movements full without nystagmus.fundi were not visualized. Vision acuity and fields appear normal. Hearing is normal. Palatal movements are normal. Face symmetric. Tongue midline. Normal strength, tone, reflexes and coordination. Normal sensation. Gait deferred.         ASSESSMENT/PLAN Andrew Burton is a 72 y.o. male with history of cerebral amyloid angiitis by biopsy, HTN, seizure disorder, DM, and ETOH and substance abuse in remission  presenting with glucose > 600, UTI, and increased confusion. He did not receive IV t-PA due to late presentation and no focal deficits.   PLAN  Cerebral amyloid angiitis - see if confusion improves with treatment of UTI and normalization of glucose.  Pt's daughter would like him to receive his cyclophosphamide as scheduled monthly - next dose due 02/02/2018.     Mikey Bussing PA-C [facility] Pager [phone] 01/27/2018, 3:16 PM  I have personally examined this patient, reviewed notes, independently viewed imaging studies, participated in medical decision making and plan of care.ROS completed by me personally and pertinent positives fully documented  I have made any additions or clarifications directly to the above note. Agree with note above.  I have reviewed the patient's medical chart and research there is rare condition that he has.  Given the fact that the family feels there is definite clinical improvement with cyclophosphamide as well as there was objective improvement on the MRI I feel it may be appropriate to continue monthly cyclophosphamide treatment course.  This typically is done as an outpatient in the cancer center but we will discuss with pharmacy to arrange this during this hospitalization.  Patient however is unlikely to be able to go home and may have to go to skilled nursing facility and this may present further challenges and giving cyclophosphamide.  Agree with reducing prednisone to 20 mg daily and if  sugar control with an issue may consider reducing further.  I had a long discussion with the patient's daughter about the above issues and answered questions.  Greater than 50% time during this 35-minute visit was spent on counseling and coordination of care about his amyloid angiitis and planning treatment.  Antony Contras, MD Medical Director [place] Pager: [phone] 01/27/2018 3:38 PM    Hospital day # 5     To contact Stroke Continuity provider, please refer to [email or web address] After hours, contact General Neurology

## 2018-01-27 NOTE — Progress Notes (Signed)
Pt continue to be aggressive verbally and physicly poor safety awarness, multiple attempts to leave bed, verbalized desire to leave hospital. Pt pulled IV during day. IV team unable to establish IV access due to pt being combative.  MD notified and order to continue restraints obtained.  Alternative measures implemented in order to discontinue restraints, such as reorientation, redirecting, folding linen, watching TV, room settings adjustments. All of the above measures failed and restraints continued.

## 2018-01-27 NOTE — Progress Notes (Signed)
Attempted PIV start without success. Pt very confused, attempting to get out of bed, cursing, and swinging to hit at this RN and others helping. Unable to obtain access due to patient behavior. RN at bedside and aware. Randol Kern, RN VAST Team

## 2018-01-27 NOTE — Progress Notes (Signed)
Patient Demographics:    Andrew Burton, is a 72 y.o. male, DOB - December 17, 1945, WCH:[id number]  Admit date - 01/21/2018   Admitting Physician Vianne Bulls, MD  Outpatient Primary MD for the patient is Patient, No Pcp Per  LOS - 5   Chief Complaint  Patient presents with  . Hyperglycemia  . Altered Mental Status        Subjective:    Andrew Burton today is resting, had episodes of agitation overnight  Assessment  & Plan :    Principal Problem:   Uncontrolled type 2 DM with hyperosmolar nonketotic hyperglycemia (HCC) Active Problems:   Renal insufficiency   Hyperkalemia   Acute lower UTI   Insulin-requiring or dependent type II diabetes mellitus (HCC)   Cerebral amyloid angiopathy (HCC)   Restlessness and agitation   Essential hypertension   Acute cystitis without hematuria  Brief  Summary 72 y.o. male with medical history significant for alcohol abuse in remission, seizure disorder, hypertension, seizure disorder, diabetes mellitus, and recent prolonged hospital admission for confusion and agitation in [facility]) with workup including brain biopsy suggestive of Cerebral Amyloid beta-related angiitis admitted on 01/22/2018 with agitation and confusion and hyperglycemia   Plan:- 1)DM2- improving control with changes to insulin regimen, hyperglycemia secondary to steroids, c/n Lantus insulin  20 units twice daily, c/n sliding scale insulin,  last A1c 7.9,    2)Sz Do--- history of seizures in the setting of alcohol withdrawal, okay to continue lacosamide 100 mg bid and Depakote 750 mg twice daily  3)HTN--stable, continue clonidine 0.1 mg twice daily and Coreg 12.5 mg twice daily  4)Cerebral Amyloid Angiopathy - Patient reportedly had brain biopsy during recent hospitalization in [place] and was diagnosed with cerebral amyloid angiitis  - He is being managed with prednisone and  was referred to vascular neurologist at [facility] (Dr [name], [phone]) with plans to start treatment with chemotherapeutic next month per family , next appointment at [facility] is 01/31/2018 patient apparently scheduled to get cyclophosphamide on 02/02/2018,  Change prednisone to  10 mg  Daily, last dose 01/30/18    5) Klebsiella and E. coli UTI--clinically improving, treated with IV Rocephin, c/n  Keflex 500 twice daily per sensitivity report ,   6)AKI----acute kidney injury , improving,   creatinine on admission= 1.85 ,   baseline creatinine = unknown   , creatinine is now= 1.1     , renally adjust medications, avoid nephrotoxic agents/dehydration/hypotension  7)Dementia with behavioral disturbance/cognitive deficits----patient remains agitated, confused, not cooperative, requiring PRN sedatives and restraints, Zyprexa was increased to 10 mg nightly, may use additional PRN Zyprexa, continue Depakote 750 twice daily, check Depakote levels, Psych Consult appreciated, as per psychiatrist consult from 01/26/2018 patient does not have capacity to make informed decisions  8)Social--plan of care discussed with patient's daughter [name], questions answered, pt has another daughter and son in [place] state  Code Status : Full code  Disposition/Need for in-Hospital Stay- patient unable to be discharged at this time due to   ongoing agitation requiring sedative medication and physical restraints, behavioral problems may be related to cerebral amyloid angiopathy superimposed on underlying dementia with resolving UTI.  Patient will need placement to facility but his behavioral issues need to be  better controlled before skilled nursing facility will accept him.  Neurology and psychiatry consultation dated 01/26/2018 appreciated   Disposition Plan  :  ?? SNF....  DVT Prophylaxis  :   SCDs   Lab Results  Component Value Date   PLT 97 (L) 01/24/2018    Inpatient Medications  Scheduled Meds: . atorvastatin  20 mg Oral  QHS  . atovaquone  1,500 mg Oral Daily  . carvedilol  12.5 mg Oral BID WC  . cephALEXin  500 mg Oral Q12H  . cholecalciferol  1,000 Units Oral QODAY  . cloNIDine  0.1 mg Oral BID  . divalproex  750 mg Oral Q12H  . famotidine  20 mg Oral BID  . feeding supplement (ENSURE ENLIVE)  237 mL Oral BID BM  . folic acid  1 mg Oral Daily  . insulin aspart  0-20 Units Subcutaneous TID WC  . insulin aspart  0-5 Units Subcutaneous QHS  . insulin aspart  3 Units Subcutaneous TID WC  . insulin glargine  20 Units Subcutaneous BID  . lacosamide  100 mg Oral BID  . LORazepam  2 mg Intravenous Once  . OLANZapine  10 mg Oral QHS  . [START ON 01/28/2018] predniSONE  10 mg Oral Q breakfast  . senna  17.2 mg Oral QHS   Continuous Infusions:  PRN Meds:.LORazepam, OLANZapine    Anti-infectives (From admission, onward)   Start     Dose/Rate Route Frequency Ordered Stop   01/25/18 1000  cephALEXin (KEFLEX) capsule 500 mg     500 mg Oral Every 12 hours 01/24/18 1032 01/29/18 0959   01/23/18 0200  cefTRIAXone (ROCEPHIN) 1 g in sodium chloride 0.9 % 100 mL IVPB  Status:  Discontinued     1 g 200 mL/hr over 30 Minutes Intravenous Every 24 hours 01/22/18 0246 01/24/18 1032   01/22/18 1000  atovaquone (MEPRON) 750 MG/5ML suspension 1,500 mg     1,500 mg Oral Daily 01/22/18 0246     01/22/18 0215  cefTRIAXone (ROCEPHIN) 1 g in sodium chloride 0.9 % 100 mL IVPB     1 g 200 mL/hr over 30 Minutes Intravenous  Once 01/22/18 0207 01/22/18 0300        Objective:   Vitals:   01/26/18 1910 01/26/18 2339 01/27/18 0015 01/27/18 0409  BP: (!) 146/83 (!) 121/97  134/71  Pulse:      Resp: 14 18  13   Temp:  98.2 F (36.8 C) 97.6 F (36.4 C) 97.9 F (36.6 C)  TempSrc:  Oral Axillary Axillary  SpO2:  98%    Weight:      Height:        Wt Readings from Last 3 Encounters:  01/21/18 86.6 kg     Intake/Output Summary (Last 24 hours) at 01/27/2018 1025 Last data filed at 01/27/2018 0900 Gross per 24 hour    Intake 340 ml  Output 800 ml  Net -460 ml     Physical Exam  Gen:- Awake Alert,  In no apparent distress  HEENT:- [place].AT, No sclera icterus Neck-Supple Neck,No JVD,.  Lungs-  CTAB , good air movement CV- S1, S2 normal Abd-  +ve B.Sounds, Abd Soft, No tenderness,    Extremity/Skin:- No  edema,   warm and dry Psych-cognitive deficits noted , occasional episodes of agitation and restlessness Neuro-generalized weakness and mobility related deficits , no new focal deficits, no tremors   Data Review:   Micro Results Recent Results (from the past 240 hour(s))  Urine culture  Status: Abnormal   Collection Time: 01/22/18  1:05 AM  Result Value Ref Range Status   Specimen Description URINE, RANDOM  Final   Special Requests   Final    NONE Performed at [facility], [address], [place], [place] [address]    Culture (A)  Final    >=100,000 COLONIES/mL KLEBSIELLA PNEUMONIAE >=100,000 COLONIES/mL ESCHERICHIA COLI    Report Status 01/24/2018 FINAL  Final   Organism ID, Bacteria KLEBSIELLA PNEUMONIAE (A)  Final   Organism ID, Bacteria ESCHERICHIA COLI (A)  Final      Susceptibility   Escherichia coli - MIC*    AMPICILLIN >=32 RESISTANT Resistant     CEFAZOLIN <=4 SENSITIVE Sensitive     CEFTRIAXONE <=1 SENSITIVE Sensitive     CIPROFLOXACIN 0.5 SENSITIVE Sensitive     GENTAMICIN <=1 SENSITIVE Sensitive     IMIPENEM <=0.25 SENSITIVE Sensitive     NITROFURANTOIN <=16 SENSITIVE Sensitive     TRIMETH/SULFA <=20 SENSITIVE Sensitive     AMPICILLIN/SULBACTAM 16 INTERMEDIATE Intermediate     PIP/TAZO <=4 SENSITIVE Sensitive     Extended ESBL NEGATIVE Sensitive     * >=100,000 COLONIES/mL ESCHERICHIA COLI   Klebsiella pneumoniae - MIC*    AMPICILLIN >=32 RESISTANT Resistant     CEFAZOLIN <=4 SENSITIVE Sensitive     CEFTRIAXONE <=1 SENSITIVE Sensitive     CIPROFLOXACIN <=0.25 SENSITIVE Sensitive     GENTAMICIN <=1 SENSITIVE Sensitive     IMIPENEM <=0.25 SENSITIVE Sensitive      NITROFURANTOIN 64 INTERMEDIATE Intermediate     TRIMETH/SULFA <=20 SENSITIVE Sensitive     AMPICILLIN/SULBACTAM >=32 RESISTANT Resistant     PIP/TAZO 32 INTERMEDIATE Intermediate     Extended ESBL NEGATIVE Sensitive     * >=100,000 COLONIES/mL KLEBSIELLA PNEUMONIAE  MRSA PCR Screening     Status: None   Collection Time: 01/22/18  6:15 AM  Result Value Ref Range Status   MRSA by PCR NEGATIVE NEGATIVE Final    Comment:        The GeneXpert MRSA Assay (FDA approved for NASAL specimens only), is one component of a comprehensive MRSA colonization surveillance program. It is not intended to diagnose MRSA infection nor to guide or monitor treatment for MRSA infections. Performed at [facility], [place] [address] [place], [place] [address]     Radiology Reports No results found.   CBC Recent Labs  Lab 01/21/18 2346 01/23/18 0338 01/24/18 0511  WBC 5.9 7.7 6.4  HGB 12.2* 11.8* 11.8*  HCT 38.1* 37.1* 36.9*  PLT 131* 109* 97*  MCV 106.7* 107.8* 106.6*  MCH 34.2* 34.3* 34.1*  MCHC 32.0 31.8 32.0  RDW 12.9 12.8 12.7  LYMPHSABS 1.0  --   --   MONOABS 0.4  --   --   EOSABS 0.0  --   --   BASOSABS 0.0  --   --    Chemistries  Recent Labs  Lab 01/22/18 1008 01/22/18 1249 01/23/18 0338 01/24/18 0511 01/25/18 0252  NA 150* 147* 139 135 141  K 3.6 4.1 5.1 5.6* 3.9  CL 111 108 106 101 107  CO2 24 28 24 24 27   GLUCOSE 60* 89 316* 441* 101*  BUN 33* 33* 26* 24* 18  CREATININE 1.51* 1.53* 1.35* 1.31* 1.04  CALCIUM 9.0 8.9 8.2* 8.2* 8.7*  MG  --   --  1.9 2.0  --   AST  --   --  12* 15  --   ALT  --   --  12 9  --   ALKPHOS  --   --  45 64  --   BILITOT  --   --  0.7 0.5  --    ------------------------------------------------------------------------------------------------------------------ No results for input(s): CHOL, HDL, LDLCALC, TRIG, CHOLHDL, LDLDIRECT in the last 72 hours.  Lab Results  Component Value Date   HGBA1C 7.9 (H) 01/22/2018    ------------------------------------------------------------------------------------------------------------------ No results for input(s): TSH, T4TOTAL, T3FREE, THYROIDAB in the last 72 hours.  Invalid input(s): FREET3 ------------------------------------------------------------------------------------------------------------------ No results for input(s): VITAMINB12, FOLATE, FERRITIN, TIBC, IRON, RETICCTPCT in the last 72 hours.  Coagulation profile No results for input(s): INR, PROTIME in the last 168 hours.  No results for input(s): DDIMER in the last 72 hours.  Cardiac Enzymes No results for input(s): CKMB, TROPONINI, MYOGLOBIN in the last 168 hours.  Invalid input(s): CK ------------------------------------------------------------------------------------------------------------------ No results found for: BNP  Roxan Hockey M.D on 01/27/2018 at 10:25 AM  Pager---[phone] Go to [email or web address] - password [email or web address] for contact info  [facility] - Office  [phone]

## 2018-01-27 NOTE — Progress Notes (Signed)
Pt very confused--- will not keep still for PIV insertion and does not comprehend explanation and need of procedure; pt attempted to get out of bed multiple times.  RN notified.

## 2018-01-28 ENCOUNTER — Inpatient Hospital Stay (HOSPITAL_COMMUNITY): Payer: Medicare Other

## 2018-01-28 DIAGNOSIS — I680 Cerebral amyloid angiopathy: Secondary | ICD-10-CM

## 2018-01-28 LAB — BASIC METABOLIC PANEL
Anion gap: 12 (ref 5–15)
BUN: 22 mg/dL (ref 8–23)
CO2: 27 mmol/L (ref 22–32)
Calcium: 9.2 mg/dL (ref 8.9–10.3)
Chloride: 101 mmol/L (ref 98–111)
Creatinine, Ser: 1.23 mg/dL (ref 0.61–1.24)
GFR calc Af Amer: >60 mL/min (ref >60)
GFR calc non Af Amer: 58 mL/min — ABNORMAL LOW (ref >60)
GLUCOSE: 242 mg/dL — AB (ref 70–99)
POTASSIUM: 3.5 mmol/L (ref 3.5–5.1)
Sodium: 140 mmol/L (ref 135–145)

## 2018-01-28 LAB — GLUCOSE, CAPILLARY
GLUCOSE-CAPILLARY: 266 mg/dL — AB (ref 70–99)
GLUCOSE-CAPILLARY: 134 mg/dL — AB (ref 70–99)
Glucose-Capillary: 49 mg/dL — ABNORMAL LOW (ref 70–99)
Glucose-Capillary: 63 mg/dL — ABNORMAL LOW (ref 70–99)
Glucose-Capillary: 94 mg/dL (ref 70–99)
Glucose-Capillary: 182 mg/dL — ABNORMAL HIGH (ref 70–99)

## 2018-01-28 LAB — AMMONIA: Ammonia: 35 umol/L (ref 9–35)

## 2018-01-28 LAB — TSH: TSH: 2.799 u[IU]/mL (ref 0.350–4.500)

## 2018-01-28 NOTE — Progress Notes (Signed)
Patient is very confused not able to follow command. Paged [facility] informed of patient mental status and the restraint orders expire in 3hrs . Patient attempting to get OOB stating he don't belong here. Stated that he see hundreds of people.

## 2018-01-28 NOTE — Progress Notes (Signed)
Physical Therapy Treatment Patient Details Name: Andrew Burton MRN: [id number] DOB: 12-Aug-1945 Today's Date: 01/28/2018    History of Present Illness Andrew Burton is a 72 y.o. 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    PT Comments    Pt admitted with above diagnosis. Pt currently with functional limitations due to the deficits listed below (see PT Problem List). Pt was able to ambulate on unit with chair follow by daughter with +2 min assist with RW.  Pt did need mod assist to sit in chair when pt fatigued due to pt gets distracted and can't focus on the task at hand. Will follow acutely.   Pt will benefit from skilled PT to increase their independence and safety with mobility to allow discharge to the venue listed below.     Follow Up Recommendations  SNF;Supervision/Assistance - 24 hour     Equipment Recommendations  Other (comment)(TBA)    Recommendations for Other Services       Precautions / Restrictions Precautions Precautions: Fall Precaution Comments: can be combative Restrictions Weight Bearing Restrictions: No    Mobility  Bed Mobility Overal bed mobility: Needs Assistance Bed Mobility: Supine to Sit     Supine to sit: Min assist Sit to supine: Min assist   General bed mobility comments: assist for safety with lines coming up to EOB, assist to guide into supine position.  Pt in UE restraints and waist restraint  Transfers Overall transfer level: Needs assistance Equipment used: Rolling walker (2 wheeled) Transfers: Sit to/from Stand Sit to Stand: Min assist;+2 safety/equipment         General transfer  comment: assist for safety and with initiation for transition  Ambulation/Gait Ambulation/Gait assistance: Min assist;+2 safety/equipment;Mod assist Gait Distance (Feet): 250 Feet Assistive device: Rolling walker (2 wheeled) Gait Pattern/deviations: Decreased stride length;Leaning posteriorly;Wide base of support;Staggering left;Staggering right   Gait velocity interpretation: 1.31 - 2.62 ft/sec, indicative of limited community ambulator General Gait Details: assist for initiation, for balance, to keep hands on RW and to steer RW.  At times, if pt stopped, LEs would buckle and needing cues to initiate movement again.  On fatigue, pt with buckling and chair was behind pt however once cued to sit down, pt would not sit down.  Took incr time to get pt to sit down in chair and had to physically assist him to chair.    Stairs             Wheelchair Mobility    Modified Rankin (Stroke Patients Only)       Balance Overall balance assessment: Needs assistance Sitting-balance support: Feet supported Sitting balance-Leahy Scale: Good Sitting balance - Comments: balances EOB unsupported, would not engage in functional activity as distracted and reports unable to don socks   Standing balance support: Bilateral upper extremity supported;During functional activity Standing balance-Leahy Scale: Poor Standing balance comment: UE support for balance as well as external support with need for +2 when pt gets confused or distracted internally                            Cognition Arousal/Alertness: Awake/alert Behavior During Therapy: Flat affect Overall Cognitive Status: Impaired/Different from baseline Area of  Impairment: Attention;Memory;Following commands;Safety/judgement;Awareness;Problem solving;Orientation                 Orientation Level: Disoriented to;Place;Time;Situation Current Attention Level: Focused Memory: Decreased short-term memory Following Commands:  Follows one step commands with increased time Safety/Judgement: Decreased awareness of safety;Decreased awareness of deficits Awareness: Intellectual Problem Solving: Slow processing;Decreased initiation;Difficulty sequencing;Requires verbal cues;Requires tactile cues General Comments: Patient not responding to questions, difficulty with arousal but had not had anything since 11pm last night (Ativan)      Exercises      General Comments General comments (skin integrity, edema, etc.): Daughter present and very helpful with pt, pushing chair behind pt and encouraging pt.       Pertinent Vitals/Pain Pain Assessment: No/denies pain Faces Pain Scale: No hurt    Home Living                      Prior Function            PT Goals (current goals can now be found in the care plan section) Acute Rehab PT Goals Patient Stated Goal: unable to state Progress towards PT goals: Progressing toward goals    Frequency    Min 2X/week      PT Plan Current plan remains appropriate    Co-evaluation              AM-PAC PT "6 Clicks" Daily Activity  Outcome Measure  Difficulty turning over in bed (including adjusting bedclothes, sheets and blankets)?: A Little Difficulty moving from lying on back to sitting on the side of the bed? : Unable Difficulty sitting down on and standing up from a chair with arms (e.g., wheelchair, bedside commode, etc,.)?: A Lot Help needed moving to and from a bed to chair (including a wheelchair)?: A Lot Help needed walking in hospital room?: A Lot Help needed climbing 3-5 steps with a railing? : A Lot 6 Click Score: 12    End of Session Equipment Utilized During Treatment: Gait belt Activity Tolerance: Patient limited by fatigue Patient left: with call bell/phone within reach;in bed;with restraints reapplied;with bed alarm set;with family/visitor present;with SCD's reapplied Nurse Communication: Mobility status PT Visit Diagnosis: Muscle  weakness (generalized) (M62.81);Other abnormalities of gait and mobility (R26.89);Other symptoms and signs involving the nervous system (R29.898)     Time: 1530-1606 PT Time Calculation (min) (ACUTE ONLY): 36 min  Charges:  $Gait Training: 23-37 mins                     [place] [phone] (pager)    Denice Paradise 01/28/2018, 4:40 PM

## 2018-01-28 NOTE — Plan of Care (Signed)
  Problem: Nutrition: Goal: Adequate nutrition will be maintained Outcome: Progressing   Problem: Safety: Goal: Ability to remain free from injury will improve Outcome: Progressing   

## 2018-01-28 NOTE — Progress Notes (Signed)
EEG completed, results pending. 

## 2018-01-28 NOTE — Progress Notes (Signed)
Subjective: When asked how he is feeling today Andrew Burton says "fine". He does not express any new concerns.   Exam: Vitals:   01/27/18 2103 01/28/18 0646  BP: 140/71 118/63  Pulse: 68 (!) 49  Resp: 16 16  Temp: 98 F (36.7 C) 98 F (36.7 C)  SpO2: 99% 98%   Gen: In bed sitting up being assisted with eating breakfast, NAD Resp: non-labored breathing, no acute distress Abd: soft, nt  Neuro: MS: Oriented to name and year of birth, believes he is in [place], says the year is 2019. There is perseveration on words and motor actions. He follows commands, There is difficulty understanding or executing portions of the cranial nerve exam.  CN: pupils equal and reactive, visual fields are intact, there is a defect with abduction of the left eye, smile symmetric, tongue protrudes midline Motor: asterixis is present, hand grip, upper extremity flexors and extenders 5/5, hip flexors 4+/5  Sensory: sensation to touch equal and intact over bilateral upper extremities DTR: biceps, patellar and babinski's reflexes are 2+ bilaterally   Pertinent Labs:   Impression: Andrew Burton is a 72 year old man with PMH HTN, DM, Seizure disorder, and biopsy proven amyloid angiitis who was admit for confusion and agitation and is being treated for HHS, UTI, and amyloid angiitis.   Recommendations: 1)Cerebral Amyloid Angitis - prednisone has been reduced to 10 mg daily  - will coordinate with pharmacy for cyclophosphamide dose due 9/7, this will have to be delivered from [facility] or the cancer center   Hx of seizure disorder - continue depakote 750 mg BID and lacosamide 100 mg BID  - will obtain EEG monitoring   Asterixis  - will obtain a TSH and BMP  UTI  - continue antibiotics per primary   Hypertension  - continue home medications per primary   DM  - continue insulin therapy per primary   Ledell Noss, MD  PGY 3, [facility]   @MPKSIGN @
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
## 2018-01-28 NOTE — Progress Notes (Signed)
CSW notes patient still aggressive. SNFs unable to accept patient at this time. CSW will continue to follow patient for placement pending decrease of negative behaviors.  Percell Locus Madyn Ivins LCSW [phone]

## 2018-01-28 NOTE — Procedures (Signed)
History: 72 yo male with a history of amyloid angiitis and seizures presented with altered mental status  Sedation: None  Technique: This is a 21 channel routine scalp EEG performed at the bedside with bipolar and monopolar montages arranged in accordance to the international 10/20 system of electrode placement. One channel was dedicated to EKG recording.    Background: There is slowing of the underlying background rhythms with a posterior dominant rhythm achieving a maximal frequency of 7 Hz.  In addition there is mild generalized irregular delta and theta activity throughout the recording.  No epileptiform activity was seen.  Photic stimulation: Physiologic driving is not performed  EEG Abnormalities: 1) generalized irregular slow activity 2) slow posterior dominant rhythm  Clinical Interpretation: This EEG is consistent with a mild generalized nonspecific cerebral dysfunction (encephalopathy). There was no seizure or seizure predisposition recorded on this study. Please note that a normal EEG does not preclude the possibility of epilepsy.   Roland Rack, MD [facility] [phone]  If 7pm- 7am, please page neurology on call as listed in [place].

## 2018-01-28 NOTE — Progress Notes (Signed)
Inpatient Diabetes Program Recommendations  AACE/ADA: New Consensus Statement on Inpatient Glycemic Control (2015)  Target Ranges:  Prepandial:   less than 140 mg/dL      Peak postprandial:   less than 180 mg/dL (1-2 hours)      Critically ill patients:  140 - 180 mg/dL   Lab Results  Component Value Date   GLUCAP 182 (H) 01/28/2018   HGBA1C 7.9 (H) 01/22/2018    Review of Glycemic Control  Diabetes history: DM2 Outpatient Diabetes medications: Humalog 2 units tidwc Current orders for Inpatient glycemic control: Lantus 20 units bid, Novolog 0-20 units tidwc and hs + 3 units tidwc On Prednisone 10 mg QD Having hypoglycemia. Needs insulin adjustment.  Inpatient Diabetes Program Recommendations:     Decrease Lantus to 12 units bid.  Will continue to watch glucose trends.  Thank you. Lorenda Peck, RD, LDN, CDE Inpatient Diabetes Coordinator [phone]

## 2018-01-28 NOTE — Progress Notes (Addendum)
Pt sleeps throughout night, will wake up periodically and become very aggressive and combative. Pt physically very strong and wont fallow commands. Very poor safety awareness, does not seems to be aware of physical environment, pt at risk for self injury at this time.   Able to establish IV access when pt was asleep, Ativan not given, RN concerned about oversedating pt, since he is violent only periodically when awake. Will continue restraints and monitor pt closely. At this time no signs and symptoms of any distress, VS stable.

## 2018-01-28 NOTE — Progress Notes (Signed)
Patient Demographics:    Andrew Burton, is a 72 y.o. male, DOB - [birth year], TKZ:[id number]  Admit date - 01/21/2018   Admitting Physician Vianne Bulls, MD  Outpatient Primary MD for the patient is Patient, No Pcp Per  LOS - 6   Chief Complaint  Patient presents with  . Hyperglycemia  . Altered Mental Status        Subjective:    [name] today is w/o fevers, no vomiting or intake is fair  Assessment  & Plan :    Principal Problem:   Uncontrolled type 2 DM with hyperosmolar nonketotic hyperglycemia (HCC) Active Problems:   Renal insufficiency   Hyperkalemia   Acute lower UTI   Insulin-requiring or dependent type II diabetes mellitus (HCC)   Cerebral amyloid angiopathy (HCC)   Restlessness and agitation   Essential hypertension   Acute cystitis without hematuria  Brief  Summary 72 y.o. male with medical history significant for alcohol abuse in remission, seizure disorder, hypertension, seizure disorder, diabetes mellitus, and recent prolonged hospital admission for confusion and agitation in [place] [facility]) with workup including brain biopsy suggestive of Cerebral Amyloid beta-related angiitis admitted on 01/22/2018 with agitation and confusion and hyperglycemia.  Serum ammonia is not elevated, TSH is normal and EEG without acute findings   Plan:- 1)DM2- improving control with changes to insulin regimen, hyperglycemia secondary to steroids, c/n Lantus insulin  20 units twice daily, c/n sliding scale insulin,  last A1c 7.9,    2)Sz Do--- history of seizures in the setting of alcohol withdrawal, c/n lacosamide 100 mg bid and Depakote 750 mg twice daily, EEG without acute findings, Depakote level pending  3)HTN--stable, continue clonidine 0.1 mg twice daily and Coreg 12.5 mg twice daily  4)Cerebral Amyloid Angiopathy - Patient reportedly had brain biopsy during recent  hospitalization in [place] and was diagnosed with cerebral amyloid angiitis  - He is being managed with prednisone and was referred to vascular neurologist at [facility] (Dr [name], [phone]) with plans to start treatment with chemotherapeutic next month per family , next appointment at [facility] is 01/31/2018 patient apparently scheduled to get cyclophosphamide on 02/02/2018,  c/n prednisone 10 mg  Daily, last dose 01/30/18    5) Klebsiella and E. coli UTI--clinically improving, treated with IV Rocephin, c/n  Keflex 500 twice daily per sensitivity report ,   6)AKI----acute kidney injury , improving,   creatinine on admission= 1.85 ,   baseline creatinine = unknown   , creatinine is now= 1.2     , renally adjust medications, avoid nephrotoxic agents/dehydration/hypotension  7)Dementia with behavioral disturbance/cognitive deficits----patient remains agitated, confused, not cooperative, requiring PRN sedatives and restraints, Zyprexa was increased to 10 mg nightly, may use additional PRN Zyprexa, continue Depakote 750 twice daily, check Depakote levels, Psych Consult appreciated, as per psychiatrist consult from 01/26/2018 patient does not have capacity to make informed decisions  8)Social--plan of care discussed with patient's daughter [name], questions answered, pt has another daughter and son in [place] state  Code Status : Full code  Disposition/Need for in-Hospital Stay- patient unable to be discharged at this time due to   ongoing agitation requiring sedative medication and physical restraints, behavioral problems may be related to cerebral amyloid angiopathy superimposed on  underlying dementia with resolving UTI.  Patient will need placement to facility but his behavioral issues need to be better controlled before skilled nursing facility will accept him.  Neurology and psychiatry consultation dated 01/26/2018 appreciated, awaiting placement to skilled nursing facility once agitation and behavioral issues  improve, neurology and psychiatry helping to manage patient   Disposition Plan  :  ?? SNF.... Once agitation and behavioral problems improve  DVT Prophylaxis  :   SCDs   Lab Results  Component Value Date   PLT 97 (L) 01/24/2018    Inpatient Medications  Scheduled Meds: . atorvastatin  20 mg Oral QHS  . atovaquone  1,500 mg Oral Daily  . carvedilol  12.5 mg Oral BID WC  . cephALEXin  500 mg Oral Q12H  . cholecalciferol  1,000 Units Oral QODAY  . cloNIDine  0.1 mg Oral BID  . divalproex  750 mg Oral Q12H  . famotidine  20 mg Oral BID  . feeding supplement (ENSURE ENLIVE)  237 mL Oral BID BM  . folic acid  1 mg Oral Daily  . insulin aspart  0-20 Units Subcutaneous TID WC  . insulin aspart  0-5 Units Subcutaneous QHS  . insulin aspart  3 Units Subcutaneous TID WC  . insulin glargine  20 Units Subcutaneous BID  . lacosamide  100 mg Oral BID  . LORazepam  1 mg Intramuscular Once  . LORazepam  2 mg Intravenous Once  . OLANZapine  10 mg Oral QHS  . predniSONE  10 mg Oral Q breakfast  . senna  17.2 mg Oral QHS   Continuous Infusions:  PRN Meds:.LORazepam, OLANZapine    Anti-infectives (From admission, onward)   Start     Dose/Rate Route Frequency Ordered Stop   01/25/18 1000  cephALEXin (KEFLEX) capsule 500 mg     500 mg Oral Every 12 hours 01/24/18 1032 01/29/18 0959   01/23/18 0200  cefTRIAXone (ROCEPHIN) 1 g in sodium chloride 0.9 % 100 mL IVPB  Status:  Discontinued     1 g 200 mL/hr over 30 Minutes Intravenous Every 24 hours 01/22/18 0246 01/24/18 1032   01/22/18 1000  atovaquone (MEPRON) 750 MG/5ML suspension 1,500 mg     1,500 mg Oral Daily 01/22/18 0246     01/22/18 0215  cefTRIAXone (ROCEPHIN) 1 g in sodium chloride 0.9 % 100 mL IVPB     1 g 200 mL/hr over 30 Minutes Intravenous  Once 01/22/18 0207 01/22/18 0300        Objective:   Vitals:   01/27/18 0409 01/27/18 2103 01/28/18 0646 01/28/18 1345  BP: 134/71 140/71 118/63 118/64  Pulse:  68 (!) 49 69   Resp: 13 16 16 18   Temp: 97.9 F (36.6 C) 98 F (36.7 C) 98 F (36.7 C) 98 F (36.7 C)  TempSrc: Axillary Oral Axillary Oral  SpO2:  99% 98% 99%  Weight:      Height:        Wt Readings from Last 3 Encounters:  01/21/18 86.6 kg     Intake/Output Summary (Last 24 hours) at 01/28/2018 1905 Last data filed at 01/28/2018 1830 Gross per 24 hour  Intake 760 ml  Output 400 ml  Net 360 ml     Physical Exam  Patient is examined daily including today on 01/28/18  , exams remain the same as of yesterday except that has changed    Gen:- Awake Alert,  In no apparent distress  HEENT:- [place].AT, No sclera icterus Neck-Supple Neck,No  JVD,.  Lungs-  CTAB , good air movement CV- S1, S2 normal Abd-  +ve B.Sounds, Abd Soft, No tenderness,    Extremity/Skin:- No  edema,   warm and dry Psych-cognitive deficits noted , occasional episodes of agitation and restlessness Neuro-generalized weakness and mobility related deficits , no new focal deficits, no tremors   Data Review:   Micro Results Recent Results (from the past 240 hour(s))  Urine culture     Status: Abnormal   Collection Time: 01/22/18  1:05 AM  Result Value Ref Range Status   Specimen Description URINE, RANDOM  Final   Special Requests   Final    NONE Performed at [facility], [place] [address] [place], [place] [address]    Culture (A)  Final    >=100,000 COLONIES/mL KLEBSIELLA PNEUMONIAE >=100,000 COLONIES/mL ESCHERICHIA COLI    Report Status 01/24/2018 FINAL  Final   Organism ID, Bacteria KLEBSIELLA PNEUMONIAE (A)  Final   Organism ID, Bacteria ESCHERICHIA COLI (A)  Final      Susceptibility   Escherichia coli - MIC*    AMPICILLIN >=32 RESISTANT Resistant     CEFAZOLIN <=4 SENSITIVE Sensitive     CEFTRIAXONE <=1 SENSITIVE Sensitive     CIPROFLOXACIN 0.5 SENSITIVE Sensitive     GENTAMICIN <=1 SENSITIVE Sensitive     IMIPENEM <=0.25 SENSITIVE Sensitive     NITROFURANTOIN <=16 SENSITIVE Sensitive      TRIMETH/SULFA <=20 SENSITIVE Sensitive     AMPICILLIN/SULBACTAM 16 INTERMEDIATE Intermediate     PIP/TAZO <=4 SENSITIVE Sensitive     Extended ESBL NEGATIVE Sensitive     * >=100,000 COLONIES/mL ESCHERICHIA COLI   Klebsiella pneumoniae - MIC*    AMPICILLIN >=32 RESISTANT Resistant     CEFAZOLIN <=4 SENSITIVE Sensitive     CEFTRIAXONE <=1 SENSITIVE Sensitive     CIPROFLOXACIN <=0.25 SENSITIVE Sensitive     GENTAMICIN <=1 SENSITIVE Sensitive     IMIPENEM <=0.25 SENSITIVE Sensitive     NITROFURANTOIN 64 INTERMEDIATE Intermediate     TRIMETH/SULFA <=20 SENSITIVE Sensitive     AMPICILLIN/SULBACTAM >=32 RESISTANT Resistant     PIP/TAZO 32 INTERMEDIATE Intermediate     Extended ESBL NEGATIVE Sensitive     * >=100,000 COLONIES/mL KLEBSIELLA PNEUMONIAE  MRSA PCR Screening     Status: None   Collection Time: 01/22/18  6:15 AM  Result Value Ref Range Status   MRSA by PCR NEGATIVE NEGATIVE Final    Comment:        The GeneXpert MRSA Assay (FDA approved for NASAL specimens only), is one component of a comprehensive MRSA colonization surveillance program. It is not intended to diagnose MRSA infection nor to guide or monitor treatment for MRSA infections. Performed at [facility], [place] [address] [place], [place] [address]     Radiology Reports No results found.   CBC Recent Labs  Lab 01/21/18 2346 01/23/18 0338 01/24/18 0511  WBC 5.9 7.7 6.4  HGB 12.2* 11.8* 11.8*  HCT 38.1* 37.1* 36.9*  PLT 131* 109* 97*  MCV 106.7* 107.8* 106.6*  MCH 34.2* 34.3* 34.1*  MCHC 32.0 31.8 32.0  RDW 12.9 12.8 12.7  LYMPHSABS 1.0  --   --   MONOABS 0.4  --   --   EOSABS 0.0  --   --   BASOSABS 0.0  --   --    Chemistries  Recent Labs  Lab 01/22/18 1249 01/23/18 0338 01/24/18 0511 01/25/18 0252 01/28/18 1149  NA 147* 139 135 141 140  K 4.1 5.1 5.6* 3.9 3.5  CL 108 106 101 107 101  CO2 28 24 24 27 27   GLUCOSE 89 316* 441* 101* 242*  BUN 33* 26* 24* 18 22  CREATININE 1.53*  1.35* 1.31* 1.04 1.23  CALCIUM 8.9 8.2* 8.2* 8.7* 9.2  MG  --  1.9 2.0  --   --   AST  --  12* 15  --   --   ALT  --  12 9  --   --   ALKPHOS  --  45 64  --   --   BILITOT  --  0.7 0.5  --   --    ------------------------------------------------------------------------------------------------------------------ No results for input(s): CHOL, HDL, LDLCALC, TRIG, CHOLHDL, LDLDIRECT in the last 72 hours.  Lab Results  Component Value Date   HGBA1C 7.9 (H) 01/22/2018   ------------------------------------------------------------------------------------------------------------------ Recent Labs    01/28/18 1149  TSH 2.799   ------------------------------------------------------------------------------------------------------------------ No results for input(s): VITAMINB12, FOLATE, FERRITIN, TIBC, IRON, RETICCTPCT in the last 72 hours.  Coagulation profile No results for input(s): INR, PROTIME in the last 168 hours.  No results for input(s): DDIMER in the last 72 hours.  Cardiac Enzymes No results for input(s): CKMB, TROPONINI, MYOGLOBIN in the last 168 hours.  Invalid input(s): CK ------------------------------------------------------------------------------------------------------------------ No results found for: BNP  Roxan Hockey M.D on 01/28/2018 at 7:05 PM  Pager---[phone] Go to [email or web address] - password [email or web address] for contact info  [facility] - Office  [phone]

## 2018-01-28 NOTE — Care Management Important Message (Signed)
Important Message  Patient Details  Name: Andrew Burton MRN: [id number] Date of Birth: [birth year]   Medicare Important Message Given:  Yes    Naydene Kamrowski Montine Circle 01/28/2018, 8:10 AM

## 2018-01-29 LAB — CBC
HCT: 37.6 % — ABNORMAL LOW (ref 39.0–52.0)
Hemoglobin: 12 g/dL — ABNORMAL LOW (ref 13.0–17.0)
MCH: 33.6 pg (ref 26.0–34.0)
MCHC: 31.9 g/dL (ref 30.0–36.0)
MCV: 105.3 fL — AB (ref 78.0–100.0)
PLATELETS: 122 10*3/uL — AB (ref 150–400)
RBC: 3.57 MIL/uL — ABNORMAL LOW (ref 4.22–5.81)
RDW: 12.9 % (ref 11.5–15.5)
WBC: 10.2 10*3/uL (ref 4.0–10.5)

## 2018-01-29 LAB — GLUCOSE, CAPILLARY
GLUCOSE-CAPILLARY: 114 mg/dL — AB (ref 70–99)
GLUCOSE-CAPILLARY: 232 mg/dL — AB (ref 70–99)
Glucose-Capillary: 29 mg/dL — CL (ref 70–99)
Glucose-Capillary: 75 mg/dL (ref 70–99)
Glucose-Capillary: 132 mg/dL — ABNORMAL HIGH (ref 70–99)

## 2018-01-29 LAB — BASIC METABOLIC PANEL
ANION GAP: 8 (ref 5–15)
BUN: 21 mg/dL (ref 8–23)
CALCIUM: 8.7 mg/dL — AB (ref 8.9–10.3)
CO2: 29 mmol/L (ref 22–32)
Chloride: 106 mmol/L (ref 98–111)
Creatinine, Ser: 1.27 mg/dL — ABNORMAL HIGH (ref 0.61–1.24)
GFR calc Af Amer: >60 mL/min (ref >60)
GFR, EST NON AFRICAN AMERICAN: 56 mL/min — AB (ref >60)
GLUCOSE: 55 mg/dL — AB (ref 70–99)
Potassium: 3.7 mmol/L (ref 3.5–5.1)
SODIUM: 143 mmol/L (ref 135–145)

## 2018-01-29 LAB — VALPROIC ACID LEVEL: VALPROIC ACID LVL: 88 ug/mL (ref 50.0–100.0)

## 2018-01-29 MED ORDER — PREDNISONE 10 MG PO TABS: 10.0000 mg | ORAL_TABLET | Freq: Every day | ORAL | 1 refills | Status: AC

## 2018-01-29 MED ORDER — INSULIN GLARGINE 100 UNIT/ML ~~LOC~~ SOLN: 15.0000 [IU] | Freq: Two times a day (BID) | SUBCUTANEOUS | 11 refills | Status: AC

## 2018-01-29 MED ORDER — OLANZAPINE 10 MG PO TABS: 10.0000 mg | ORAL_TABLET | Freq: Every day | ORAL | 3 refills | Status: AC

## 2018-01-29 MED ORDER — INSULIN GLARGINE 100 UNIT/ML ~~LOC~~ SOLN
15.0000 [IU] | Freq: Two times a day (BID) | SUBCUTANEOUS | Status: DC
  Filled 2018-01-29 (×2): qty 0.15

## 2018-01-29 MED ORDER — INSULIN ASPART 100 UNIT/ML ~~LOC~~ SOLN: 3.0000 [IU] | Freq: Three times a day (TID) | SUBCUTANEOUS | 11 refills | Status: AC

## 2018-01-29 MED ORDER — ENSURE ENLIVE PO LIQD: 237.0000 mL | Freq: Two times a day (BID) | ORAL | 12 refills | Status: AC

## 2018-01-29 MED ORDER — OLANZAPINE 10 MG PO TABS
10.0000 mg | ORAL_TABLET | Freq: Every day | ORAL | Status: DC
  Administered 2018-01-29: 10 mg via ORAL
  Filled 2018-01-29: qty 1

## 2018-01-29 NOTE — Progress Notes (Signed)
OT Cancellation Note  Patient Details Name: Andrew Burton MRN: [id number] DOB: [birth year]   Cancelled Treatment:    Reason Eval/Treat Not Completed: Other (comment).  Pt fatiqued after PT. Was having difficulty following cues. Will likely check back on another day.  Kehaulani Fruin 01/29/2018, 11:48 AM  Lesle Chris, OTR/L [phone] 01/29/2018

## 2018-01-29 NOTE — Progress Notes (Signed)
Subjective: Overnight Mr. Vandergrift had two episodes of confusion and agitation and required two doses of ativan. He was also found to be hypoglycemic on this morning metabolic panel.   Today he tells me that he is feeling normal and that he is at home. He perseverates on telling me that he is "home and normal".   Exam: Vitals:   01/29/18 0620 01/29/18 0621  BP: 106/67 106/67  Pulse: (!) 58 (!) 59  Resp: 16 16  Temp: (!) 97.3 F (36.3 C) (!) 97.3 F (36.3 C)  SpO2: 100% 100%   Gen: Sitting up in bed sleeping, NAD Resp: non-labored breathing, no acute distress Abd: soft, nt  Neuro: MS: Appears fatigued. Attempts at answering questions are delayed, sometimes questions are unanswered. He believes he is "home", will not tell me the year or his name. He intermittently follows commands.  CN: does not abduct the left eye, tongue protrudes midline Motor: still with abnormal tone, asterixis. 5/5 strength in upper and lower extremities bilateral.  DTR: biceps, patellar and babinski's reflexes are 2+ bilaterally, babinski down going bilateral   Pertinent Labs:  TSH 2.7  Depakote level 5 am 88  Glucose 33  WBC 10.2, Hemoglobin 12  Impression: Mr. Domzalski is a 72 year old man with PMH HTN, DM, Seizure disorder, and biopsy proven amyloid angiitis who was admit for worsening of his baseline confusion and agitation and is being treated for HHS, UTI, and amyloid angiitis.   Recommendations: 1)Cerebral Amyloid Angitis  - continue prednisone 10 mg daily  - obtain MRI brain  - It would be favorable to have the cyclophosphamide dose given at [place] where it is scheduled but if he is still admit on 9/7 when he is due we will coordinate with pharmacy and this will have to be delivered from [facility] or the cancer center   Hx of seizure disorder - EEG with generalized irregular slow activity indicative or generalized cerebral dysfunction and did not capture any seizures while in place  -  depakote level at 5 am- 88 this was checked 9 hours after the previous dose - continue depakote 750 mg BID and lacosamide 100 mg BID   Delirium  - initiate delirium precautions, keep lights on and blinds up during the day, minimize lines and restraints, attempt safe liberation from the bed  - schedule zyprexa at 8 pm nightly   Ledell Noss, MD  PGY 3, [facility]   @MPKSIGN @

## 2018-01-29 NOTE — Discharge Summary (Signed)
Andrew Burton, is a 72 y.o. male  DOB 1945-10-01  MRN [id number].  Admission date:  01/21/2018  Admitting Physician  Vianne Bulls, MD  Discharge Date:  01/29/2018   Primary MD  Patient, No Pcp Per  Recommendations for primary care physician for things to follow:   1)Transferred to [facility] stroke team/vascular neurology service, Attending is  Dr. Salena Saner (Dr Simonne Martinet accepting on behalf of attending neurologist) due to brain biopsy-proven cerebral amyloid angiopathy 2)Ms Ann coordinator for Dr. [name] at [facility] vascular surgery has patient's CDs, reports and other relevant information from his previous work-up in [place] 3) please monitor serum glucose closely 4) patient may need PRN sedatives for agitation, may need restraints    Admission Diagnosis  Hyperglycemia [R73.9] Acute cystitis without hematuria [N30.00]   Discharge Diagnosis  Hyperglycemia [R73.9] Acute cystitis without hematuria [N30.00]   Principal Problem:   Uncontrolled type 2 DM with hyperosmolar nonketotic hyperglycemia ([place]) Active Problems:   Cerebral amyloid angiopathy ([place])   Renal insufficiency   Hyperkalemia   Acute lower UTI   Insulin-requiring or dependent type II diabetes mellitus ([place])   Restlessness and agitation   Essential hypertension   Acute cystitis without hematuria      Past Medical History:  Diagnosis Date  . Hypertension     History reviewed. No pertinent surgical history.    HPI  from the history and physical done on the day of admission:    Patient coming from: Home   Chief Complaint: Increased confusion and agitation, hyperglycemia   HPI: Andrew Burton is a 72 y.o. male with medical history significant for alcohol abuse in remission, seizure disorder, hypertension, seizure disorder, diabetes mellitus, and recent prolonged hospital admission for confusion and agitation in [place] with workup including brain biopsy suggestive of amyloid beta-related angiitis, now presenting for evaluation of increased confusion and agitation, as well as hyperglycemia.  Patient was upset with his care at an SNF in [place] and moved in with his daughter locally one week ago. He has been confused, particularly at night when he also becomes agitated. This has been worse for the past 2 nights, EMS was called, and he was noted to have marked hyperglycemia.   He has been referred to a vascular neurologist at [facility], started on steroids, and is planned to start treatment with a chemotherapeutic next month per family report.  Daughter and granddaughter have been managing his medications and they forgot his insulin today.  ED Course: Upon arrival to the ED, patient is found to be afebrile, saturating well on room air, with vitals otherwise stable. EKG features significant artifact with indeterminate rhythm, possibly atrial fibrillation, and will be repeated. Chemistry panel reveals a glucose of 731, potassium 6.1, and creatinine 1.85 with unknown baseline. CBC features a mild macrocytic anemia and thrombocytopenia. Urinalysis is nitrite positive. Patient was given a liter of normal saline, 10 units IV NovoLog, Zyprexa, Haldol, and Rocephin in the ED. Insulin infusion was started. He remains hemodynamically stable  and will be admitted for ongoing evaluation and management of HHS, UTI, and increased confusion and agitation.    Hospital Course:    Brief  Summary 72 y.o.malewith medical history significant foralcohol abuse in remission, seizure disorder, hypertension, seizure disorder, diabetes mellitus, and recent prolonged hospital admission for confusion and agitation in [place] [facility]) with workup including brain biopsy suggestive of Cerebral Amyloid beta-related angiitis admitted on 01/22/2018 with agitation and confusion and hyperglycemia.  Serum ammonia is not elevated, TSH  is normal and EEG without acute findings   Plan:- 1)DM2- improving diabetic control with changes to dosage/frequency and with changes to insulin regimen, hyperglycemia is secondary to steroids, c/n Lantus insulin  15 units twice daily, c/n sliding scale insulin,  last A1c 7.9,    2)Sz Do--- history of seizures in the setting of alcohol withdrawal, c/n lacosamide 100 mg bid and Depakote 750 mg twice daily, EEG without acute findings, Depakote level 88  3)HTN--stable, continue clonidine 0.1 mg twice daily and Coreg 12.5 mg twice daily  4)Cerebral Amyloid Angiopathy - Patient reportedly had brain biopsy during recent hospitalization in [facility] and was diagnosed with cerebral amyloid angiitis  - He is being managed with prednisone and Monthly cyclophosphamide injections/infusions and was referred to vascular neurologist at [facility], [phone]) with plans to start treatment with chemotherapeutic  per family, next appointment at [facility] is 01/31/2018 patient apparently scheduled to get cyclophosphamide on 02/02/2018, c/n prednisone 10 mg  Daily, Transferred to [facility] stroke team/vascular neurology service, Attending is  Dr. Salena Saner (Dr Simonne Martinet accepting on behalf of attending neurologist) due to brain biopsy-proven cerebral amyloid angiopathy  5) Klebsiella and E. coli UTI--clinically resolved UTI, treated with IV Rocephin and completed Keflex 500 twice daily per sensitivity report ,   6)AKI----acute kidney injury , improving,   creatinine on admission= 1.85 ,   baseline creatinine = unknown   , creatinine is now= 1.2     , renally adjust medications, avoid nephrotoxic agents/dehydration/hypotension  7)Dementia with behavioral disturbance/cognitive deficits----patient remains agitated, confused, not cooperative, requiring PRN sedatives and restraints, Zyprexa was increased to 10 mg nightly, may use additional PRN Zyprexa, continue Depakote 750 twice  daily (Depakote level is 88),  Psych Consult appreciated, as per psychiatrist consult from 01/26/2018 patient does not have capacity to make informed decisions  8)Social--plan of care discussed with patient's daughter [name], questions answered, pt has another daughter and son in [place] state  Code Status : Full code  Disposition/Need for in-Hospital Stay-    Neurology and psychiatry consultation dated 01/26/2018 appreciated, Transferred to [facility] stroke team/vascular neurology service, Attending is  Dr. Salena Saner (Dr Simonne Martinet accepting on behalf of attending neurologist) due to brain biopsy-proven cerebral amyloid angiopathy   Disposition Plan  :  [place] stroke/vascular neurology team- Dr. Salena Saner is attending neurologist DVT Prophylaxis  :   SCDs    Discharge Condition: Behavioral changes persist, UTI is resolved, hyperglycemia is resolving  Follow UP  Follow-up Information    [facility] on 02/22/2018.   Specialty:  Family Medicine Why:  9:50 am Contact information: [place] [id number] [phone]           Consults obtained - psych/Neuro  Diet and Activity recommendation:  As advised  Discharge Instructions    Discharge Instructions    Bed rest   Complete by:  As directed    Fall precautions   Call MD for:  difficulty breathing, headache or visual disturbances   Complete by:  As directed    Call MD for:  persistant dizziness or light-headedness   Complete by:  As directed    Call MD for:  persistant nausea and vomiting   Complete by:  As directed    Call MD for:  severe uncontrolled pain   Complete by:  As directed    Call MD for:  temperature >100.4   Complete by:  As directed    Diet Carb Modified   Complete by:  As directed    Discharge instructions   Complete by:  As directed    1)Transferred to [facility] stroke team/vascular neurology service, Attending is  Dr. Salena Saner  (Dr Simonne Martinet accepting on behalf of attending neurologist) due to brain biopsy-proven cerebral amyloid angiopathy 2)Ms Ann coordinator for Dr. [name] at [facility] vascular surgery has patient's CDs, reports and other relevant information from his previous work-up in [place] 3) please monitor serum glucose closely 4) patient may need PRN sedatives for agitation, may need restraints        Discharge Medications     Allergies as of 01/29/2018   No Known Allergies     Medication List    TAKE these medications   acetaminophen 325 MG tablet Commonly known as:  TYLENOL Take 650 mg by mouth every 4 (four) hours as needed for mild pain.   atorvastatin 20 MG tablet Commonly known as:  LIPITOR Take 20 mg by mouth at bedtime.   atovaquone 750 MG/5ML suspension Commonly known as:  MEPRON Take 1,500 mg by mouth daily.   calcium carbonate 1250 (500 Ca) MG tablet Commonly known as:  OS-CAL - dosed in mg of elemental calcium Take 1 tablet by mouth daily with breakfast.   carvedilol 12.5 MG tablet Commonly known as:  COREG Take 12.5 mg by mouth 2 (two) times daily with a meal.   cholecalciferol 1000 units tablet Commonly known as:  VITAMIN D Take 1,000 Units by mouth every other day.   cloNIDine 0.1 MG tablet Commonly known as:  CATAPRES Take 0.1 mg by mouth 2 (two) times daily.   divalproex 250 MG DR tablet Commonly known as:  DEPAKOTE Take 750 mg by mouth 2 (two) times daily.   famotidine 20 MG tablet Commonly known as:  PEPCID Take 20 mg by mouth 2 (two) times daily.   feeding supplement (ENSURE ENLIVE) Liqd Take 237 mLs by mouth 2 (two) times daily between meals.   folic acid 1 MG tablet Commonly known as:  FOLVITE Take 1 mg by mouth daily.   insulin aspart 100 UNIT/ML injection Commonly known as:  novoLOG Inject 3 Units into the skin 3 (three) times daily with meals.   insulin glargine 100 UNIT/ML injection Commonly known as:  LANTUS Inject 0.15 mLs (15 Units  total) into the skin 2 (two) times daily.   Lacosamide 100 MG Tabs Take 100 mg by mouth 2 (two) times daily.   Melatonin 5 MG Tabs Take 5 mg by mouth at bedtime.   OLANZapine 10 MG tablet Commonly known as:  ZYPREXA Take 1 tablet (10 mg total) by mouth daily at 8 pm. What changed:    medication strength  how much to take  when to take this  reasons to take this   predniSONE 10 MG tablet Commonly known as:  DELTASONE Take 1 tablet (10 mg total) by mouth daily with breakfast. Start taking on:  01/30/2018 What changed:  when  to take this   SENOKOT XTRA 17.2 MG Tabs Generic drug:  Sennosides Take 17.2 mg by mouth at bedtime.       Major procedures and Radiology Reports - PLEASE review detailed and final reports for all details, in brief -   No results found.  Micro Results   Recent Results (from the past 240 hour(s))  Urine culture     Status: Abnormal   Collection Time: 01/22/18  1:05 AM  Result Value Ref Range Status   Specimen Description URINE, RANDOM  Final   Special Requests   Final    NONE Performed at [facility], [address], [place], [place] [address]    Culture (A)  Final    >=100,000 COLONIES/mL KLEBSIELLA PNEUMONIAE >=100,000 COLONIES/mL ESCHERICHIA COLI    Report Status 01/24/2018 FINAL  Final   Organism ID, Bacteria KLEBSIELLA PNEUMONIAE (A)  Final   Organism ID, Bacteria ESCHERICHIA COLI (A)  Final      Susceptibility   Escherichia coli - MIC*    AMPICILLIN >=32 RESISTANT Resistant     CEFAZOLIN <=4 SENSITIVE Sensitive     CEFTRIAXONE <=1 SENSITIVE Sensitive     CIPROFLOXACIN 0.5 SENSITIVE Sensitive     GENTAMICIN <=1 SENSITIVE Sensitive     IMIPENEM <=0.25 SENSITIVE Sensitive     NITROFURANTOIN <=16 SENSITIVE Sensitive     TRIMETH/SULFA <=20 SENSITIVE Sensitive     AMPICILLIN/SULBACTAM 16 INTERMEDIATE Intermediate     PIP/TAZO <=4 SENSITIVE Sensitive     Extended ESBL NEGATIVE Sensitive     * >=100,000 COLONIES/mL ESCHERICHIA  COLI   Klebsiella pneumoniae - MIC*    AMPICILLIN >=32 RESISTANT Resistant     CEFAZOLIN <=4 SENSITIVE Sensitive     CEFTRIAXONE <=1 SENSITIVE Sensitive     CIPROFLOXACIN <=0.25 SENSITIVE Sensitive     GENTAMICIN <=1 SENSITIVE Sensitive     IMIPENEM <=0.25 SENSITIVE Sensitive     NITROFURANTOIN 64 INTERMEDIATE Intermediate     TRIMETH/SULFA <=20 SENSITIVE Sensitive     AMPICILLIN/SULBACTAM >=32 RESISTANT Resistant     PIP/TAZO 32 INTERMEDIATE Intermediate     Extended ESBL NEGATIVE Sensitive     * >=100,000 COLONIES/mL KLEBSIELLA PNEUMONIAE  MRSA PCR Screening     Status: None   Collection Time: 01/22/18  6:15 AM  Result Value Ref Range Status   MRSA by PCR NEGATIVE NEGATIVE Final    Comment:        The GeneXpert MRSA Assay (FDA approved for NASAL specimens only), is one component of a comprehensive MRSA colonization surveillance program. It is not intended to diagnose MRSA infection nor to guide or monitor treatment for MRSA infections. Performed at [facility], [place] [address], [place], [place] [address]        Today   Subjective    Thai Burgueno today has no new complaints, no fevers, patient did have borderline low blood sugars, so Lantus insulin been decreased to 15 units from 20 units          Patient has been seen and examined prior to discharge   Objective   Blood pressure 112/72, pulse 68, temperature 98 F (36.7 C), resp. rate 16, height 5\' 7"  (1.702 m), weight 86.6 kg, SpO2 98 %.   Intake/Output Summary (Last 24 hours) at 01/29/2018 1407 Last data filed at 01/29/2018 0900 Gross per 24 hour  Intake 900 ml  Output 1000 ml  Net -100 ml    Exam Gen:- Awake Alert,  In no apparent distress  HEENT:- [place].AT, No sclera icterus Neck-Supple Neck,No JVD,.  Lungs-  CTAB , good air movement CV- S1, S2 normal  Abd-  +ve B.Sounds, Abd Soft, No tenderness,    Extremity/Skin:- No  edema,   warm and dry Psych-cognitive deficits noted , mostly confused,  occasional episodes of agitation and restlessness Neuro-generalized weakness and mobility related deficits , , no tremors   Data Review   CBC w Diff:  Lab Results  Component Value Date   WBC 10.2 01/29/2018   HGB 12.0 (L) 01/29/2018   HCT 37.6 (L) 01/29/2018   PLT 122 (L) 01/29/2018   LYMPHOPCT 18 01/21/2018   MONOPCT 7 01/21/2018   EOSPCT 0 01/21/2018   BASOPCT 1 01/21/2018    CMP:  Lab Results  Component Value Date   NA 143 01/29/2018   K 3.7 01/29/2018   CL 106 01/29/2018   CO2 29 01/29/2018   BUN 21 01/29/2018   CREATININE 1.27 (H) 01/29/2018   PROT 5.6 (L) 01/24/2018   ALBUMIN 2.8 (L) 01/24/2018   BILITOT 0.5 01/24/2018   ALKPHOS 64 01/24/2018   AST 15 01/24/2018   ALT 9 01/24/2018    Total Discharge time is about 33 minutes  Roxan Hockey M.D on 01/29/2018 at 2:07 PM  Pager---[phone]  Go to [email or web address] - password [email or web address] for contact info  [facility] - Office  [phone]

## 2018-01-29 NOTE — Progress Notes (Signed)
Hypoglycemic Event  CBG: 31  Treatment: 15 GM carbohydrate snack  Symptoms: Hungry and None  Follow-up CBG: Time: 0849 CBG Result: 75  Possible Reasons for Event: Unknown  Comments/MD notified: Dr Denton Brick aware    Andrew Burton

## 2018-01-29 NOTE — Discharge Instructions (Signed)
1)Transferred to [facility] stroke team/vascular neurology service, Attending is  Dr. Salena Saner (Dr Simonne Martinet accepting on behalf of attending neurologist) due to brain biopsy-proven cerebral amyloid angiopathy 2)Ms Ann coordinator for Dr. [name] at [facility] vascular surgery has patient's CDs, reports and other relevant information from his previous work-up in [place] 3) please monitor serum glucose closely 4) patient may need PRN sedatives for agitation, may need restraints

## 2018-01-29 NOTE — Progress Notes (Signed)
CSW notes patient is transferring to [facility]. CSW will sign off. Please re-consult if needed.   Andrew Locus Darvell Monteforte LCSW [phone]

## 2018-01-29 NOTE — Progress Notes (Signed)
Report called and given to [facility]. Pt expected to be transported by [facility] to  [id number].
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
## 2018-01-29 NOTE — Progress Notes (Signed)
Physical Therapy Treatment Patient Details Name: Andrew Burton MRN: [id number] DOB: April 01, 1946 Today's Date: 01/29/2018    History of Present Illness Andrew Burton is a 72 y.o. 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    PT Comments    Patient seen for mobility progression. Pt continues to present with impaired cognition and with limited participation during session. Pt requires assistance for all OOB mobility given balance deficits and decreased safety awareness. Continue to progress as tolerated with anticipated d/c to SNF for further skilled PT services.     Follow Up Recommendations  SNF;Supervision/Assistance - 24 hour     Equipment Recommendations  Other (comment)(TBA)    Recommendations for Other Services       Precautions / Restrictions Precautions Precautions: Fall Precaution Comments: can be combative; easily agitated Restrictions Weight Bearing Restrictions: No    Mobility  Bed Mobility Overal bed mobility: Needs Assistance Bed Mobility: Supine to Sit;Sit to Supine     Supine to sit: Min guard Sit to supine: Mod assist   General bed mobility comments: min guard for safety for supine to sit and assistance required to return to supine due to pt not respondingn to cues and attempting to stand when asked to lay down   Transfers Overall transfer level: Needs assistance Equipment used: Rolling walker (2 wheeled);1 person hand held assist Transfers: Sit to/from Stand Sit to Stand: Mod assist;Min assist         General transfer comment: assist to power up into standing and to maintain balance in standing; pt stood  several times from EOB; assistance required for eccentric loading and tactile cues to initiate sitting; pt with posterior bias; pt distracted by lines and gown and would not place hands on RW despite max cues  Ambulation/Gait                 Stairs             Wheelchair Mobility    Modified Rankin (Stroke Patients Only)       Balance Overall balance assessment: Needs assistance Sitting-balance support: Feet supported Sitting balance-Leahy Scale: Good     Standing balance support: Bilateral upper extremity supported;During functional activity Standing balance-Leahy Scale: Poor                              Cognition Arousal/Alertness: Awake/alert Behavior During Therapy: Flat affect;Agitated;Impulsive Overall Cognitive Status: Impaired/Different from baseline Area of Impairment: Attention;Memory;Following commands;Safety/judgement;Awareness;Problem solving;Orientation                 Orientation Level: Disoriented to;Place;Time;Situation Current Attention Level: Focused Memory: Decreased short-term memory Following Commands: Follows one step commands with increased time;Follows one step commands inconsistently Safety/Judgement: Decreased awareness of safety;Decreased awareness of deficits Awareness: Intellectual Problem Solving: Slow processing;Decreased initiation;Difficulty sequencing;Requires verbal cues;Requires tactile cues General Comments: pt continues to be confused and with slow processing; pt not responding appropriately most of session       Exercises      General Comments        Pertinent Vitals/Pain Pain Assessment: No/denies pain    Home Living  Prior Function            PT Goals (current goals can now be found in the care plan section) Acute Rehab PT Goals Patient Stated Goal: unable to state Progress towards PT goals: Not progressing toward goals - comment(cognition limiting  participation)    Frequency    Min 2X/week      PT Plan Current plan remains appropriate    Co-evaluation              AM-PAC PT "6 Clicks" Daily Activity  Outcome Measure  Difficulty turning over in bed (including adjusting bedclothes, sheets and blankets)?: A Little Difficulty moving from lying on back to sitting on the side of the bed? : Unable Difficulty sitting down on and standing up from a chair with arms (e.g., wheelchair, bedside commode, etc,.)?: Unable Help needed moving to and from a bed to chair (including a wheelchair)?: A Lot Help needed walking in hospital room?: A Lot Help needed climbing 3-5 steps with a railing? : Total 6 Click Score: 10    End of Session Equipment Utilized During Treatment: Gait belt Activity Tolerance: Other (comment)(limited by mentation ) Patient left: with call bell/phone within reach;in bed;with restraints reapplied;with bed alarm set;with SCD's reapplied Nurse Communication: Mobility status PT Visit Diagnosis: Muscle weakness (generalized) (M62.81);Other abnormalities of gait and mobility (R26.89);Other symptoms and signs involving the nervous system (R29.898)     Time: 1779-3903 PT Time Calculation (min) (ACUTE ONLY): 45 min  Charges:  $Therapeutic Activity: 38-52 mins                     Earney Navy, PTA Pager: [phone]     Darliss Cheney 01/29/2018, 11:36 AM

## 2018-01-29 NOTE — Progress Notes (Signed)
Nutrition Follow-up  INTERVENTION:   - Continue to provide Ensure Enlive po BID, each supplement provides 350 kcal and 20 grams of protein  - Encourage adequate PO intake at meals and provide feeding assistance as needed  - Recommend obtaining new measured weight as last recorded weight is from 01/21/18  NUTRITION DIAGNOSIS:   Inadequate oral intake related to lethargy/confusion as evidenced by meal completion < 25%.  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 Pt now admitted with DKA and UTI.   Pt is awaiting SNF placement once agitation and behavior issues improve.  Spoke with RN at bedside. PT in room with pt at time of visit. Pt was very drowsy and did not awaken to RD voice.  Per RN, pt did "okay" at breakfast. RN reports pt has been feeding himself but tends to get distracted or drowsy mid-meal and may not finish the portions. RN also reports pt with a low blood sugar this morning. Pt had applesauce to increase blood sugar which may have suppressed his appetite for breakfast.  Meal Completion: averaging 83% since 8/29  Medications reviewed and include: 1000 units vitamin D every other day, 20 mg Pepcid BID, Ensure Enlive BID, 1 mg folic acid daily, sliding scale Novolog, 3 units Novolog TID with meals, 20 units Lantus BID, Senokot daily  Labs reviewed: creatinine 1.27 (H), hemoglobin 12.0 (L), HCT 37.6 (L) CBG's: 75, 31, 29, 134, 266, 182 x 24 hours  UOP: 1000 ml x 24 hours I/O's: +4.9 L since admission  Diet Order:   Diet  Order            Diet Carb Modified Fluid consistency: Thin; Room service appropriate? Yes  Diet effective now              EDUCATION NEEDS:   No education needs have been identified at this time  Skin:  Skin Assessment: Reviewed RN Assessment  Last BM:  01/25/18  Height:   Ht Readings from Last 1 Encounters:  01/21/18 5\' 7"  (1.702 m)    Weight:   Wt Readings from Last 1 Encounters:  01/21/18 86.6 kg    Ideal Body Weight:  67.2 kg  BMI:  Body mass index is 29.91 kg/m.  Estimated Nutritional Needs:   Kcal:  1840-2000  Protein:  95-110 grams  Fluid:  >/= 1.8 L    Gaynell Face, MS, RD, LDN Pager: [phone] Weekend/After Hours: [phone]

## 2018-01-30 LAB — VALPROIC ACID LEVEL, FREE: Valproic Acid, Free: 22.8 ug/mL — ABNORMAL HIGH (ref 6.0–22.0)

## 2018-01-30 LAB — GLUCOSE, CAPILLARY: Glucose-Capillary: 31 mg/dL — CL (ref 70–99)

## 2018-02-22 ENCOUNTER — Inpatient Hospital Stay (INDEPENDENT_AMBULATORY_CARE_PROVIDER_SITE_OTHER): Payer: Medicare Other | Admitting: Physician Assistant

## 2018-03-22 ENCOUNTER — Encounter (HOSPITAL_COMMUNITY): Payer: Self-pay

## 2018-03-22 ENCOUNTER — Other Ambulatory Visit: Payer: Self-pay

## 2018-03-22 ENCOUNTER — Emergency Department (HOSPITAL_COMMUNITY)
Admission: EM | Admit: 2018-03-22 | Discharge: 2018-03-23 | Disposition: A | Discharge status: Home / Self Care | Payer: Medicare Other | Attending: Emergency Medicine | Admitting: Emergency Medicine

## 2018-03-22 DIAGNOSIS — Z794 Long term (current) use of insulin: Secondary | ICD-10-CM | POA: Diagnosis not present

## 2018-03-22 DIAGNOSIS — I10 Essential (primary) hypertension: Secondary | ICD-10-CM | POA: Diagnosis not present

## 2018-03-22 DIAGNOSIS — E119 Type 2 diabetes mellitus without complications: Secondary | ICD-10-CM | POA: Insufficient documentation

## 2018-03-22 DIAGNOSIS — F69 Unspecified disorder of adult personality and behavior: Secondary | ICD-10-CM

## 2018-03-22 DIAGNOSIS — F0390 Unspecified dementia without behavioral disturbance: Secondary | ICD-10-CM | POA: Diagnosis not present

## 2018-03-22 DIAGNOSIS — Z79899 Other long term (current) drug therapy: Secondary | ICD-10-CM | POA: Insufficient documentation

## 2018-03-22 DIAGNOSIS — F918 Other conduct disorders: Secondary | ICD-10-CM | POA: Insufficient documentation

## 2018-03-22 DIAGNOSIS — F17210 Nicotine dependence, cigarettes, uncomplicated: Secondary | ICD-10-CM | POA: Diagnosis not present

## 2018-03-22 MED ORDER — HALOPERIDOL 2 MG PO TABS: 2.0000 mg | ORAL_TABLET | Freq: Two times a day (BID) | ORAL | 0 refills | Status: AC | PRN

## 2018-03-22 NOTE — ED Notes (Signed)
Spoke with Angela Nevin ED at [facility] she wants our ED provider to write an Rx for haldol because the patient just arrived to their facility 2 hours ago and has not been seen by a physician. Angela Nevin states he will not be seen by a physician until Monday and will use the ED for any further needs over the weekend. Explained to Angela Nevin at [facility] that patient was currently sleeping and has not demonstrated any aggressive behavior since arrival to this ED.
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
## 2018-03-22 NOTE — Discharge Instructions (Addendum)
Please continue Andrew Burton' medications as prescribed.  Have him follow up with Neurology and psychiatry.

## 2018-03-22 NOTE — ED Notes (Signed)
Patient is currently resting quietly in [place] bed with eyes closed-patient has not exhibited any aggressive behavior since arriving to this ED

## 2018-03-22 NOTE — ED Notes (Signed)
Since Pt arrived to ED he has been calm and compliment. No evidence of behavior reported by nursing home.

## 2018-03-22 NOTE — ED Notes (Signed)
Attempt to call report to [facility] put on hold and no one every came to the phone

## 2018-03-22 NOTE — ED Notes (Signed)
PTAR notified of need for transport back to facility-PTAR continues with long delay in transport
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
## 2018-03-23 NOTE — ED Notes (Signed)
PTAR here to transport patient back to facility

## 2018-03-23 NOTE — ED Notes (Signed)
Report to Andrew Burton at [facility] 

## 2018-04-19 ENCOUNTER — Emergency Department (HOSPITAL_COMMUNITY)
Admission: EM | Admit: 2018-04-19 | Discharge: 2018-04-19 | Disposition: A | Discharge status: Home / Self Care | Payer: Medicare Other | Attending: Emergency Medicine | Admitting: Emergency Medicine

## 2018-04-19 ENCOUNTER — Other Ambulatory Visit: Payer: Self-pay

## 2018-04-19 ENCOUNTER — Encounter (HOSPITAL_COMMUNITY): Payer: Self-pay | Admitting: Emergency Medicine

## 2018-04-19 DIAGNOSIS — E119 Type 2 diabetes mellitus without complications: Secondary | ICD-10-CM | POA: Diagnosis not present

## 2018-04-19 DIAGNOSIS — F17210 Nicotine dependence, cigarettes, uncomplicated: Secondary | ICD-10-CM | POA: Diagnosis not present

## 2018-04-19 DIAGNOSIS — Z79899 Other long term (current) drug therapy: Secondary | ICD-10-CM | POA: Insufficient documentation

## 2018-04-19 DIAGNOSIS — I10 Essential (primary) hypertension: Secondary | ICD-10-CM | POA: Insufficient documentation

## 2018-04-19 DIAGNOSIS — F0390 Unspecified dementia without behavioral disturbance: Secondary | ICD-10-CM | POA: Diagnosis not present

## 2018-04-19 HISTORY — DX: Benign neoplasm of brain, unspecified: D33.2

## 2018-04-19 HISTORY — DX: Unspecified dementia, unspecified severity, without behavioral disturbance, psychotic disturbance, mood disturbance, and anxiety: F03.90

## 2018-04-19 HISTORY — DX: Alcohol abuse, uncomplicated: F10.10

## 2018-04-19 HISTORY — DX: Cocaine abuse, uncomplicated: F14.10

## 2018-04-19 HISTORY — DX: Unspecified viral hepatitis C without hepatic coma: B19.20

## 2018-04-19 LAB — CBG MONITORING, ED: Glucose-Capillary: 105 mg/dL — ABNORMAL HIGH (ref 70–99)

## 2018-04-19 MED ORDER — QUETIAPINE FUMARATE 25 MG PO TABS
25.0000 mg | ORAL_TABLET | Freq: Once | ORAL | Status: AC
  Administered 2018-04-19: 25 mg via ORAL
  Filled 2018-04-19: qty 1

## 2018-04-19 MED ORDER — CIPROFLOXACIN HCL 500 MG PO TABS
500.0000 mg | ORAL_TABLET | Freq: Two times a day (BID) | ORAL | Status: DC
  Administered 2018-04-19: 500 mg via ORAL
  Filled 2018-04-19: qty 1

## 2018-04-19 MED ORDER — LACOSAMIDE 50 MG PO TABS
100.0000 mg | ORAL_TABLET | Freq: Two times a day (BID) | ORAL | Status: DC
  Administered 2018-04-19: 100 mg via ORAL
  Filled 2018-04-19: qty 2

## 2018-04-19 MED ORDER — LORAZEPAM 1 MG PO TABS
1.0000 mg | ORAL_TABLET | Freq: Every day | ORAL | Status: DC
  Administered 2018-04-19: 1 mg via ORAL
  Filled 2018-04-19: qty 1

## 2018-04-19 MED ORDER — CARVEDILOL 12.5 MG PO TABS
12.5000 mg | ORAL_TABLET | Freq: Two times a day (BID) | ORAL | Status: DC
  Administered 2018-04-19: 12.5 mg via ORAL
  Filled 2018-04-19 (×2): qty 1

## 2018-04-19 MED ORDER — QUETIAPINE FUMARATE 100 MG PO TABS
100.0000 mg | ORAL_TABLET | Freq: Every day | ORAL | Status: DC
  Administered 2018-04-19: 100 mg via ORAL
  Filled 2018-04-19: qty 1

## 2018-04-19 MED ORDER — DIVALPROEX SODIUM 125 MG PO CSDR
750.0000 mg | DELAYED_RELEASE_CAPSULE | Freq: Two times a day (BID) | ORAL | Status: DC
  Administered 2018-04-19: 750 mg via ORAL
  Filled 2018-04-19: qty 6

## 2018-04-19 MED ORDER — ATORVASTATIN CALCIUM 20 MG PO TABS
20.0000 mg | ORAL_TABLET | Freq: Every day | ORAL | Status: DC
  Administered 2018-04-19: 20 mg via ORAL
  Filled 2018-04-19: qty 1

## 2018-04-19 MED ORDER — CLONIDINE HCL 0.1 MG PO TABS
0.1000 mg | ORAL_TABLET | Freq: Two times a day (BID) | ORAL | Status: DC
  Administered 2018-04-19: 0.1 mg via ORAL
  Filled 2018-04-19: qty 1

## 2018-04-19 NOTE — ED Notes (Signed)
Chaplain at bedside speaking with patient and daughter.
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
## 2018-04-19 NOTE — ED Notes (Signed)
Bed: [facility] Expected date:  Expected time:  Means of arrival:  Comments: EMS-lethargic

## 2018-04-19 NOTE — ED Notes (Signed)
Social Worker has updated Designer, multimedia. Social worker is now talking with patient and daughter.

## 2018-04-19 NOTE — ED Notes (Signed)
Social Worker at bedside with patient. Social worker will update RN once available on patient status and disposition.
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
## 2018-04-19 NOTE — ED Triage Notes (Signed)
Per EMS-states patient had a neuro treatment for chronic condition yesterday at [facility] his nursing facility [facility]) states he is more lethargic than normal after his procedure-at baseline mentally, just lethargic

## 2018-04-19 NOTE — ED Notes (Signed)
Charge RN and ED Provider have been made aware of facility refusing to accept patient back.
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
## 2018-04-19 NOTE — ED Notes (Signed)
Pt wheeled to daughters car by this RN. Pt given nighttime medication and stable at times of discharge.

## 2018-04-19 NOTE — ED Notes (Signed)
Charge RN Marzetta Board is speaking with Development worker, international aid right now.
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
## 2018-04-19 NOTE — Progress Notes (Signed)
CSW provided pt's daughter with a list of ALF's some which provide memory care facilities and pt's daughter thanked the CSW.  CSW will continue to follow for D/C needs.  Alphonse Guild. Gualberto Wahlen, LCSW, LCAS, CSI Clinical Social Worker Ph: [phone]

## 2018-04-19 NOTE — Progress Notes (Addendum)
CSW provided pt with the phone number for Lavella Lemons the med tech at ph: [phone]  to the pt's daughter Brogan Martis at ph: [phone] and informed her the pt's medications will be physically waiting on the pt's daughter at the facility and pt's daughter voiced understanding.  Pt's daughter asked if pt's night-time medications can be given to the pt and the CSW informed the RN who will consult with the EPD about the requested meds.  CSW will continue to follow for D/C needs.  Alphonse Guild. Aisa Schoeppner, LCSW, LCAS, CSI Clinical Social Worker Ph: [phone]
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
## 2018-04-19 NOTE — ED Notes (Signed)
Social Work has been called and notified of patient and current status regarding facility.

## 2018-04-19 NOTE — Discharge Instructions (Addendum)
Please return for any problem. Follow up with your regular care provider as instructed.   For your behavioral health needs, you are advised to follow up with an outpatient psychiatrist.  Contact Leanord Hawking, MD, at your earliest opportunity to ask about scheduling an initial appointment:       Leanord Hawking, MD      [facility]      [address]      [place], [place] [address]      [phone]

## 2018-04-27 ENCOUNTER — Encounter (HOSPITAL_COMMUNITY): Payer: Self-pay | Admitting: Nurse Practitioner

## 2018-04-27 ENCOUNTER — Emergency Department (HOSPITAL_COMMUNITY): Payer: Medicare Other

## 2018-04-27 ENCOUNTER — Other Ambulatory Visit: Payer: Self-pay

## 2018-04-27 ENCOUNTER — Emergency Department (HOSPITAL_COMMUNITY)
Admission: EM | Admit: 2018-04-27 | Discharge: 2018-05-01 | Disposition: A | Discharge status: Home / Self Care | Payer: Medicare Other | Attending: Emergency Medicine | Admitting: Emergency Medicine

## 2018-04-27 DIAGNOSIS — F0391 Unspecified dementia with behavioral disturbance: Secondary | ICD-10-CM | POA: Insufficient documentation

## 2018-04-27 DIAGNOSIS — R4689 Other symptoms and signs involving appearance and behavior: Secondary | ICD-10-CM

## 2018-04-27 DIAGNOSIS — F03918 Unspecified dementia, unspecified severity, with other behavioral disturbance: Secondary | ICD-10-CM | POA: Diagnosis present

## 2018-04-27 DIAGNOSIS — I10 Essential (primary) hypertension: Secondary | ICD-10-CM | POA: Diagnosis not present

## 2018-04-27 DIAGNOSIS — F17210 Nicotine dependence, cigarettes, uncomplicated: Secondary | ICD-10-CM | POA: Insufficient documentation

## 2018-04-27 DIAGNOSIS — E119 Type 2 diabetes mellitus without complications: Secondary | ICD-10-CM | POA: Diagnosis not present

## 2018-04-27 DIAGNOSIS — Z794 Long term (current) use of insulin: Secondary | ICD-10-CM | POA: Diagnosis not present

## 2018-04-27 DIAGNOSIS — Z79899 Other long term (current) drug therapy: Secondary | ICD-10-CM | POA: Insufficient documentation

## 2018-04-27 DIAGNOSIS — F0281 Dementia in other diseases classified elsewhere with behavioral disturbance: Secondary | ICD-10-CM | POA: Diagnosis not present

## 2018-04-27 LAB — CBC WITH DIFFERENTIAL/PLATELET
Abs Immature Granulocytes: 0.06 10*3/uL (ref 0.00–0.07)
Basophils Absolute: 0 10*3/uL (ref 0.0–0.1)
Basophils Relative: 1 %
EOS ABS: 0.5 10*3/uL (ref 0.0–0.5)
EOS PCT: 8 %
HEMATOCRIT: 31.4 % — AB (ref 39.0–52.0)
HEMOGLOBIN: 9.6 g/dL — AB (ref 13.0–17.0)
Immature Granulocytes: 1 %
LYMPHS ABS: 1.7 10*3/uL (ref 0.7–4.0)
LYMPHS PCT: 30 %
MCH: 32.3 pg (ref 26.0–34.0)
MCHC: 30.6 g/dL (ref 30.0–36.0)
MCV: 105.7 fL — ABNORMAL HIGH (ref 80.0–100.0)
MONO ABS: 0.6 10*3/uL (ref 0.1–1.0)
Monocytes Relative: 10 %
NRBC: 0 % (ref 0.0–0.2)
Neutro Abs: 2.8 10*3/uL (ref 1.7–7.7)
Neutrophils Relative %: 50 %
Platelets: 129 10*3/uL — ABNORMAL LOW (ref 150–400)
RBC: 2.97 MIL/uL — ABNORMAL LOW (ref 4.22–5.81)
RDW: 14.3 % (ref 11.5–15.5)
WBC: 5.6 10*3/uL (ref 4.0–10.5)

## 2018-04-27 LAB — RAPID URINE DRUG SCREEN, HOSP PERFORMED
AMPHETAMINES: NOT DETECTED
BENZODIAZEPINES: NOT DETECTED
Barbiturates: NOT DETECTED
Cocaine: NOT DETECTED
Opiates: NOT DETECTED
TETRAHYDROCANNABINOL: NOT DETECTED

## 2018-04-27 LAB — CBG MONITORING, ED
GLUCOSE-CAPILLARY: 184 mg/dL — AB (ref 70–99)
Glucose-Capillary: 79 mg/dL (ref 70–99)
Glucose-Capillary: 103 mg/dL — ABNORMAL HIGH (ref 70–99)
Glucose-Capillary: 102 mg/dL — ABNORMAL HIGH (ref 70–99)

## 2018-04-27 LAB — URINALYSIS, ROUTINE W REFLEX MICROSCOPIC
BILIRUBIN URINE: NEGATIVE
GLUCOSE, UA: NEGATIVE mg/dL
HGB URINE DIPSTICK: NEGATIVE
Ketones, ur: NEGATIVE mg/dL
Leukocytes, UA: NEGATIVE
Nitrite: NEGATIVE
PH: 7 (ref 5.0–8.0)
Protein, ur: NEGATIVE mg/dL
Specific Gravity, Urine: 1.012 (ref 1.005–1.030)

## 2018-04-27 LAB — COMPREHENSIVE METABOLIC PANEL
ALK PHOS: 39 U/L (ref 38–126)
ALT: 9 U/L (ref 0–44)
AST: 13 U/L — ABNORMAL LOW (ref 15–41)
Albumin: 3.5 g/dL (ref 3.5–5.0)
Anion gap: 4 — ABNORMAL LOW (ref 5–15)
BUN: 31 mg/dL — AB (ref 8–23)
CHLORIDE: 112 mmol/L — AB (ref 98–111)
CO2: 27 mmol/L (ref 22–32)
CREATININE: 1.23 mg/dL (ref 0.61–1.24)
Calcium: 8.7 mg/dL — ABNORMAL LOW (ref 8.9–10.3)
GFR, EST NON AFRICAN AMERICAN: 59 mL/min — AB (ref >60)
Glucose, Bld: 101 mg/dL — ABNORMAL HIGH (ref 70–99)
Potassium: 4.8 mmol/L (ref 3.5–5.1)
Sodium: 143 mmol/L (ref 135–145)
Total Bilirubin: 0.5 mg/dL (ref 0.3–1.2)
Total Protein: 6.4 g/dL — ABNORMAL LOW (ref 6.5–8.1)

## 2018-04-27 LAB — VALPROIC ACID LEVEL: Valproic Acid Lvl: 105 ug/mL — ABNORMAL HIGH (ref 50.0–100.0)

## 2018-04-27 MED ORDER — RISPERIDONE 1 MG PO TBDP
2.0000 mg | ORAL_TABLET | Freq: Three times a day (TID) | ORAL | Status: DC | PRN
  Administered 2018-04-27: 2 mg via ORAL
  Filled 2018-04-27: qty 2

## 2018-04-27 MED ORDER — QUETIAPINE FUMARATE 100 MG PO TABS
100.0000 mg | ORAL_TABLET | Freq: Every day | ORAL | Status: DC
  Administered 2018-04-27: 100 mg via ORAL
  Filled 2018-04-27: qty 1

## 2018-04-27 MED ORDER — DIVALPROEX SODIUM 125 MG PO CSDR
750.0000 mg | DELAYED_RELEASE_CAPSULE | Freq: Two times a day (BID) | ORAL | Status: DC
  Administered 2018-04-27 – 2018-05-01 (×7): 750 mg via ORAL
  Filled 2018-04-27 (×9): qty 6

## 2018-04-27 MED ORDER — TAMSULOSIN HCL 0.4 MG PO CAPS
0.4000 mg | ORAL_CAPSULE | Freq: Every day | ORAL | Status: DC
  Administered 2018-04-27 – 2018-04-30 (×4): 0.4 mg via ORAL
  Filled 2018-04-27 (×5): qty 1

## 2018-04-27 MED ORDER — ZIPRASIDONE MESYLATE 20 MG IM SOLR
20.0000 mg | INTRAMUSCULAR | Status: AC | PRN
  Administered 2018-04-27: 20 mg via INTRAMUSCULAR
  Filled 2018-04-27: qty 20

## 2018-04-27 MED ORDER — SODIUM CHLORIDE 0.9 % IV BOLUS: 500.0000 mL | Freq: Once | INTRAVENOUS | Status: DC

## 2018-04-27 MED ORDER — STERILE WATER FOR INJECTION IJ SOLN
INTRAMUSCULAR | Status: AC
  Administered 2018-04-27: 1.2 mL
  Filled 2018-04-27: qty 10

## 2018-04-27 MED ORDER — INSULIN ASPART 100 UNIT/ML ~~LOC~~ SOLN: 0.0000 [IU] | Freq: Three times a day (TID) | SUBCUTANEOUS | Status: DC

## 2018-04-27 MED ORDER — INSULIN ASPART 100 UNIT/ML ~~LOC~~ SOLN
0.0000 [IU] | Freq: Every day | SUBCUTANEOUS | Status: DC
  Administered 2018-04-30: 2 [IU] via SUBCUTANEOUS
  Filled 2018-04-27: qty 1

## 2018-04-27 MED ORDER — CLONIDINE HCL 0.1 MG PO TABS
0.1000 mg | ORAL_TABLET | Freq: Two times a day (BID) | ORAL | Status: DC
  Administered 2018-04-27 – 2018-05-01 (×7): 0.1 mg via ORAL
  Filled 2018-04-27 (×9): qty 1

## 2018-04-27 MED ORDER — LEVOTHYROXINE SODIUM 50 MCG PO TABS
50.0000 ug | ORAL_TABLET | Freq: Every day | ORAL | Status: DC
  Administered 2018-04-28 – 2018-05-01 (×4): 50 ug via ORAL
  Filled 2018-04-27 (×4): qty 1

## 2018-04-27 MED ORDER — LORAZEPAM 1 MG PO TABS
1.0000 mg | ORAL_TABLET | ORAL | Status: AC | PRN
  Administered 2018-04-28: 1 mg via ORAL
  Filled 2018-04-27: qty 1

## 2018-04-27 MED ORDER — CARVEDILOL 12.5 MG PO TABS
12.5000 mg | ORAL_TABLET | Freq: Two times a day (BID) | ORAL | Status: DC
  Administered 2018-04-27 – 2018-05-01 (×7): 12.5 mg via ORAL
  Filled 2018-04-27 (×10): qty 1

## 2018-04-27 MED ORDER — HALOPERIDOL 2 MG PO TABS
2.0000 mg | ORAL_TABLET | Freq: Two times a day (BID) | ORAL | Status: DC | PRN
  Administered 2018-05-01: 2 mg via ORAL
  Filled 2018-04-27 (×2): qty 1

## 2018-04-27 MED ORDER — ACETAMINOPHEN 325 MG PO TABS
650.0000 mg | ORAL_TABLET | ORAL | Status: DC | PRN
  Administered 2018-04-30: 650 mg via ORAL
  Filled 2018-04-27: qty 2

## 2018-04-27 MED ORDER — QUETIAPINE FUMARATE 25 MG PO TABS
25.0000 mg | ORAL_TABLET | Freq: Every day | ORAL | Status: DC
  Administered 2018-04-27 – 2018-05-01 (×3): 25 mg via ORAL
  Filled 2018-04-27 (×5): qty 1

## 2018-04-27 NOTE — ED Notes (Signed)
Bed: QZ83 Expected date:  Expected time:  Means of arrival:  Comments: EMS elderly with psych hx
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
## 2018-04-27 NOTE — Progress Notes (Addendum)
This patient continues to meet inpatient criteria. CSW faxed information to the following facilities:   [facility] patient on 12/01 CSW faxed chest xray, UA, drug screen, and ECG to [phone]. Clide Deutscher- reviewing patient, will need more collateral info 11/30 [place], [place] Social Worker [phone]

## 2018-04-27 NOTE — ED Notes (Addendum)
Attempted IV placement x2. Attempted straight blood draw x2. All attempts unsuccessful. MD made aware.
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
## 2018-04-27 NOTE — ED Provider Notes (Addendum)
[facility] DEPT Provider Note   CSN: [id number] Arrival date & time: 04/27/18  0028     History   Chief Complaint Chief Complaint  Patient presents with  . Aggressive Behavior  . IVC    HPI Baltasar Twilley is a 72 y.o. male.  HPI 72 year old male comes in with chief complaint of aggressive behavior. Level 5 caveat for severe dementia.  We called [phone] and no one picked up the phone call.  Patient sent from his home for aggressive behavior.  We have no further history beyond the report from home that patient has been aggressive at home. Patient currently is calm and collected. He was seen in the ER with similar complaints a few days ago.  Past Medical History:  Diagnosis Date  . Alcohol abuse   . Brain tumor (benign) ([place])   . Cocaine abuse ([place])   . Dementia ([place])   . Hepatitis C   . Hypertension     Patient Active Problem List   Diagnosis Date Noted  . Renal insufficiency 01/22/2018  . Hyperkalemia 01/22/2018  . Acute lower UTI 01/22/2018  . Insulin-requiring or dependent type II diabetes mellitus ([place]) 01/22/2018  . Uncontrolled type 2 DM with hyperosmolar nonketotic hyperglycemia ([place]) 01/22/2018  . Cerebral amyloid angiopathy ([place]) 01/22/2018  . Restlessness and agitation 01/22/2018  . Essential hypertension 01/22/2018  . Acute cystitis without hematuria     No past surgical history on file.      Home Medications    Prior to Admission medications   Medication Sig Start Date End Date Taking? Authorizing Provider  atorvastatin (LIPITOR) 20 MG tablet Take 20 mg by mouth at bedtime.    [provider]  atovaquone (MEPRON) 750 MG/5ML suspension Take 1,500 mg by mouth daily with breakfast.     [provider]  Calcium Carbonate-Vit D-Min (CALTRATE 600+D PLUS MINERALS) 600-800 MG-UNIT CHEW Chew 1 tablet by mouth daily.    [provider]  carvedilol (COREG) 12.5 MG tablet Take 12.5 mg by mouth 2  (two) times daily with a meal.    [provider]  ciprofloxacin (CIPRO) 500 MG tablet Take 500 mg by mouth 2 (two) times daily.    [provider]  cloNIDine (CATAPRES) 0.1 MG tablet Take 0.1 mg by mouth 2 (two) times daily.    [provider]  divalproex (DEPAKOTE SPRINKLE) 125 MG capsule Take 750 mg by mouth 2 (two) times daily.    [provider]  feeding supplement, ENSURE ENLIVE, (ENSURE ENLIVE) LIQD Take 237 mLs by mouth 2 (two) times daily between meals. Patient not taking: Reported on 04/19/2018 01/29/18   Roxan Hockey, MD  folic acid (FOLVITE) 1 MG tablet Take 1 mg by mouth daily.    [provider]  haloperidol (HALDOL) 2 MG tablet Take 1 tablet (2 mg total) by mouth 2 (two) times daily as needed for agitation. 03/22/18   Quintella Reichert, MD  insulin aspart (NOVOLOG) 100 UNIT/ML injection Inject 3 Units into the skin 3 (three) times daily with meals. Patient not taking: Reported on 04/19/2018 01/29/18   Roxan Hockey, MD  insulin glargine (LANTUS) 100 UNIT/ML injection Inject 0.15 mLs (15 Units total) into the skin 2 (two) times daily. Patient not taking: Reported on 04/19/2018 01/29/18   Roxan Hockey, MD  Lacosamide 100 MG TABS Take 100 mg by mouth 2 (two) times daily.    [provider]  levothyroxine (SYNTHROID, LEVOTHROID) 50 MCG tablet Take 50 mcg by mouth  daily before breakfast.    [provider]  LORazepam (ATIVAN) 0.5 MG tablet Take 0.5 mg by mouth every 6 (six) hours as needed for anxiety.    [provider]  LORazepam (ATIVAN) 1 MG tablet Take 1 mg by mouth at bedtime.    [provider]  OLANZapine (ZYPREXA) 10 MG tablet Take 1 tablet (10 mg total) by mouth daily at 8 pm. Patient not taking: Reported on 04/19/2018 01/29/18   Roxan Hockey, MD  predniSONE (DELTASONE) 10 MG tablet Take 1 tablet (10 mg total) by mouth daily with breakfast. Patient not taking: Reported on 04/19/2018 01/30/18    Roxan Hockey, MD  QUEtiapine (SEROQUEL) 100 MG tablet Take 100 mg by mouth at bedtime.    [provider]  QUEtiapine (SEROQUEL) 25 MG tablet Take 12.5 mg by mouth daily at 2 PM.    [provider]  tamsulosin (FLOMAX) 0.4 MG CAPS capsule Take 0.4 mg by mouth daily.    [provider]  thiamine (VITAMIN B-1) 100 MG tablet Take 100 mg by mouth daily.    [provider]  Vitamin D, Ergocalciferol, (DRISDOL) 1.25 MG (50000 UT) CAPS capsule Take 50,000 Units by mouth every Friday.    [provider]    Family History Family History  Problem Relation Age of Onset  . Obesity Other     Social History Social History   Tobacco Use  . Smoking status: Current Every Day Smoker    Types: Cigarettes, Pipe, Cigars, E-cigarettes  Substance Use Topics  . Alcohol use: Not Currently    Comment: former alcoholic   . Drug use: Not on file     Allergies   Patient has no known allergies.   Review of Systems Review of Systems  Unable to perform ROS: Dementia     Physical Exam Updated Vital Signs BP 132/68 (BP Location: Left Arm)   Pulse (!) 57   Temp 97.9 F (36.6 C) (Oral)   Resp 10 Comment: Pt sleeping during assessment of vitals  SpO2 100%   Physical Exam  Constitutional: He appears well-developed.  HENT:  Head: Atraumatic.  Eyes: EOM are normal.  Neck: Neck supple.  Cardiovascular: Normal rate.  Pulmonary/Chest: Effort normal.  Neurological: He is alert.  Skin: Skin is warm.  Nursing note and vitals reviewed.    ED Treatments / Results  Labs (all labs ordered are listed, but only abnormal results are displayed) Labs Reviewed  COMPREHENSIVE METABOLIC PANEL - Abnormal; Notable for the following components:      Result Value   Chloride 112 (*)    Glucose, Bld 101 (*)    BUN 31 (*)    Calcium 8.7 (*)    Total Protein 6.4 (*)    AST 13 (*)    GFR calc non Af Amer 59 (*)    Anion gap 4 (*)    All other components within  normal limits  CBC WITH DIFFERENTIAL/PLATELET - Abnormal; Notable for the following components:   RBC 2.97 (*)    Hemoglobin 9.6 (*)    HCT 31.4 (*)    MCV 105.7 (*)    Platelets 129 (*)    All other components within normal limits  VALPROIC ACID LEVEL - Abnormal; Notable for the following components:   Valproic Acid Lvl 105 (*)    All other components within normal limits  URINALYSIS, ROUTINE W REFLEX MICROSCOPIC  CBG MONITORING, ED    EKG None  Radiology Dg Chest Port 1  View  Result Date: 04/27/2018 CLINICAL DATA:  Psychiatric clearance. EXAM: PORTABLE CHEST 1 VIEW COMPARISON:  None. FINDINGS: Low lung volumes with bibasilar atelectasis, right greater than left.The cardiomediastinal contours are normal. Pulmonary vasculature is normal. No consolidation, pleural effusion, or pneumothorax. No acute osseous abnormalities are seen. IMPRESSION: Low lung volumes with bibasilar atelectasis. Electronically Signed   By: Keith Rake M.D.   On: 04/27/2018 03:23    Procedures Procedures (including critical care time)  Medications Ordered in ED Medications  sodium chloride 0.9 % bolus 500 mL (0 mLs Intravenous Hold 04/27/18 0524)     Initial Impression / Assessment and Plan / ED Course  I have reviewed the triage vital signs and the nursing notes.  Pertinent labs & imaging results that were available during my care of the patient were reviewed by me and considered in my medical decision making (see chart for details).  Clinical Course as of Apr 28 615  Sat Apr 27, 2018  0613 Medically cleared for psych evaluation.   [AN]  [phone] Depakote level is slightly elevated -not enough to be toxic. Psych team can change the medication dose.  Valproic Acid,S(!): 105 [AN]    Clinical Course User Index [AN] Varney Biles, MD    Patient brought into the ER with chief complaint of behavioral issues.  Patient has history of dementia and has demonstrated aggressive behavior in the  past. No one from the home picked up or call, therefore history is significantly limited.  Patient is calm and collected right now.   6:16 AM  Patient's Depakote is slightly elevated.  Depakote toxicity can lead to agitation -however the level is mildly elevated only, so it is unclear if it is the underlying cause for the agitation.  More likely I think this is all related to his dementia.  Final Clinical Impressions(s) / ED Diagnoses   Final diagnoses:  Aggressive behavior    ED Discharge Orders    None       Varney Biles, MD 04/27/18 [place], [place], MD 04/27/18 [phone]

## 2018-04-27 NOTE — BH Assessment (Signed)
[facility] Assessment Progress Note   Case was staffed with Darleene Cleaver MD, Marion Downer who also evaluated patient and recommended a inpatient admission. Biochemist, clinical)

## 2018-04-28 DIAGNOSIS — F0281 Dementia in other diseases classified elsewhere with behavioral disturbance: Secondary | ICD-10-CM

## 2018-04-28 DIAGNOSIS — R4689 Other symptoms and signs involving appearance and behavior: Secondary | ICD-10-CM

## 2018-04-28 DIAGNOSIS — F0391 Unspecified dementia with behavioral disturbance: Secondary | ICD-10-CM | POA: Diagnosis not present

## 2018-04-28 LAB — CBG MONITORING, ED
GLUCOSE-CAPILLARY: 88 mg/dL (ref 70–99)
Glucose-Capillary: 101 mg/dL — ABNORMAL HIGH (ref 70–99)
Glucose-Capillary: 89 mg/dL (ref 70–99)
Glucose-Capillary: 157 mg/dL — ABNORMAL HIGH (ref 70–99)

## 2018-04-28 MED ORDER — DIPHENHYDRAMINE HCL 50 MG/ML IJ SOLN
INTRAMUSCULAR | Status: AC
  Filled 2018-04-28: qty 1

## 2018-04-28 MED ORDER — DIPHENHYDRAMINE HCL 50 MG/ML IJ SOLN
50.0000 mg | Freq: Once | INTRAMUSCULAR | Status: AC
  Administered 2018-04-28: 50 mg via INTRAMUSCULAR
  Filled 2018-04-28: qty 1

## 2018-04-28 MED ORDER — OLANZAPINE 10 MG IM SOLR
10.0000 mg | Freq: Once | INTRAMUSCULAR | Status: AC
  Administered 2018-04-28: 10 mg via INTRAMUSCULAR
  Filled 2018-04-28: qty 10

## 2018-04-28 MED ORDER — QUETIAPINE FUMARATE 100 MG PO TABS
200.0000 mg | ORAL_TABLET | Freq: Every day | ORAL | Status: DC
  Administered 2018-04-28 – 2018-04-29 (×3): 200 mg via ORAL
  Filled 2018-04-28 (×4): qty 2

## 2018-04-28 NOTE — Progress Notes (Addendum)
This patient continues to meet inpatient criteria. CSW faxed information to the following facilities:   [facility] patient on 12/01 CSW faxed chest xray, UA, drug screen, and ECG to [phone]. Clide Deutscher- reviewing patient, will need more collateral info 11/30, patient poor historian [place] due to dementia diagnosis [facility] Speller- no bed availability 12/01 [place], [place] Social Worker [phone]

## 2018-04-28 NOTE — ED Notes (Signed)
Pt sleeping at present, no distress noted, calm at present. Sitter at bedside.  Monitoring for safety.

## 2018-04-28 NOTE — Consult Note (Signed)
[facility] Consult   Reason for Consult: aggressive behavior Referring Physician:  EDP Patient Identification: Mathius Birkeland MRN:  [id number] Principal Diagnosis: Dementia with behavioral disturbance ([place]) Diagnosis:  Principal Problem:   Dementia with behavioral disturbance ([place])   Total Time spent with patient: 45 minutes  Subjective:   Zohan Shiflet is a 72 y.o. male patient admitted with aggression.  HPI:  72 y.o. male with history of Cerebral Amyloid angiopathy, Hypertension, IDDM, Renal insufficiency and Dementia who was brought under IVC due to increased physical, verbal aggression and hostility towards people-family and caregiver. Patient reportedly displaying increased sexual behavior, paranoia-thinking that people are out to get him. Patient gets combative, easily agitated and has been having trouble sleeping. He was seen at [facility] ED few days ago for similar complaint. He will benefit from inpatient admission for stabilization.  Past Psychiatric History: as above  Risk to Self: Suicidal Ideation: (UTA) Suicidal Intent: (UTA) Is patient at risk for suicide?: No Suicidal Plan?: (UTA) Access to Means: (UTA) What has been your use of drugs/alcohol within the last 12 months?: (UTA) How many times?: (UTA) Other Self Harm Risks: (UTA) Triggers for Past Attempts: (UTA) Intentional Self Injurious Behavior: (UTA) Risk to Others: Homicidal Ideation: (UTA) Thoughts of Harm to Others: (UTA) Current Homicidal Intent: (UTA) Current Homicidal Plan: (UTA) Access to Homicidal Means: (UTA) Identified Victim: (UTA) History of harm to others?: Yes Assessment of Violence: On admission Violent Behavior Description: Threats to staff and family  Does patient have access to weapons?: No Criminal Charges Pending?: No Does patient have a court date: No Prior Inpatient Therapy: Prior Inpatient Therapy: No Prior Outpatient Therapy: Prior Outpatient Therapy: No Does patient  have an ACCT team?: No Does patient have Intensive In-House Services?  : No Does patient have [facility] services? : No Does patient have P4CC services?: No  Past Medical History:  Past Medical History:  Diagnosis Date  . Alcohol abuse   . Brain tumor (benign) ([place])   . Cocaine abuse ([place])   . Dementia ([place])   . Hepatitis C   . Hypertension    No past surgical history on file. Family History:  Family History  Problem Relation Age of Onset  . Obesity Other    Family Psychiatric  History:  Social History:  Social History   Substance and Sexual Activity  Alcohol Use Not Currently   Comment: former alcoholic      Social History   Substance and Sexual Activity  Drug Use Not on file    Social History   Socioeconomic History  . Marital status: Single    Spouse name: Not on file  . Number of children: Not on file  . Years of education: Not on file  . Highest education level: Not on file  Occupational History  . Not on file  Social Needs  . Financial resource strain: Not on file  . Food insecurity:    Worry: Not on file    Inability: Not on file  . Transportation needs:    Medical: Not on file    Non-medical: Not on file  Tobacco Use  . Smoking status: Current Every Day Smoker    Types: Cigarettes, Pipe, Cigars, E-cigarettes  Substance and Sexual Activity  . Alcohol use: Not Currently    Comment: former alcoholic   . Drug use: Not on file  . Sexual activity: Not on file  Lifestyle  . Physical activity:    Days per week: Not on file  Minutes per session: Not on file  . Stress: Not on file  Relationships  . Social connections:    Talks on phone: Not on file    Gets together: Not on file    Attends religious service: Not on file    Active member of club or organization: Not on file    Attends meetings of clubs or organizations: Not on file    Relationship status: Not on file  Other Topics Concern  . Not on file  Social History Narrative  . Not on file    Additional Social History:    Allergies:  No Known Allergies  Labs:  Results for orders placed or performed during the hospital encounter of 04/27/18 (from the past 48 hour(s))  POC CBG, ED     Status: None   Collection Time: 04/27/18  3:55 AM  Result Value Ref Range   Glucose-Capillary 79 70 - 99 mg/dL  Comprehensive metabolic panel     Status: Abnormal   Collection Time: 04/27/18  4:42 AM  Result Value Ref Range   Sodium 143 135 - 145 mmol/L   Potassium 4.8 3.5 - 5.1 mmol/L   Chloride 112 (H) 98 - 111 mmol/L   CO2 27 22 - 32 mmol/L   Glucose, Bld 101 (H) 70 - 99 mg/dL   BUN 31 (H) 8 - 23 mg/dL   Creatinine, Ser 1.23 0.61 - 1.24 mg/dL   Calcium 8.7 (L) 8.9 - 10.3 mg/dL   Total Protein 6.4 (L) 6.5 - 8.1 g/dL   Albumin 3.5 3.5 - 5.0 g/dL   AST 13 (L) 15 - 41 U/L   ALT 9 0 - 44 U/L   Alkaline Phosphatase 39 38 - 126 U/L   Total Bilirubin 0.5 0.3 - 1.2 mg/dL   GFR calc non Af Amer 59 (L) >60 mL/min   GFR calc Af Amer >60 >60 mL/min   Anion gap 4 (L) 5 - 15    Comment: Performed at [facility], [place] [address] [place], [place] [address]  CBC with Differential     Status: Abnormal   Collection Time: 04/27/18  4:42 AM  Result Value Ref Range   WBC 5.6 4.0 - 10.5 K/uL   RBC 2.97 (L) 4.22 - 5.81 MIL/uL   Hemoglobin 9.6 (L) 13.0 - 17.0 g/dL   HCT 31.4 (L) 39.0 - 52.0 %   MCV 105.7 (H) 80.0 - 100.0 fL   MCH 32.3 26.0 - 34.0 pg   MCHC 30.6 30.0 - 36.0 g/dL   RDW 14.3 11.5 - 15.5 %   Platelets 129 (L) 150 - 400 K/uL   nRBC 0.0 0.0 - 0.2 %   Neutrophils Relative % 50 %   Neutro Abs 2.8 1.7 - 7.7 K/uL   Lymphocytes Relative 30 %   Lymphs Abs 1.7 0.7 - 4.0 K/uL   Monocytes Relative 10 %   Monocytes Absolute 0.6 0.1 - 1.0 K/uL   Eosinophils Relative 8 %   Eosinophils Absolute 0.5 0.0 - 0.5 K/uL   Basophils Relative 1 %   Basophils Absolute 0.0 0.0 - 0.1 K/uL   Immature Granulocytes 1 %   Abs Immature Granulocytes 0.06 0.00 - 0.07 K/uL    Comment:  Performed at [facility], [place] [address], [place], [place] [address]  Valproic acid level     Status: Abnormal   Collection Time: 04/27/18  4:42 AM  Result Value Ref Range   Valproic Acid Lvl 105 (H) 50.0 -  100.0 ug/mL    Comment: Performed at [facility], [place] [address], [place], [place] [address]  CBG monitoring, ED     Status: Abnormal   Collection Time: 04/27/18 12:50 PM  Result Value Ref Range   Glucose-Capillary 184 (H) 70 - 99 mg/dL  Urinalysis, Routine w reflex microscopic     Status: None   Collection Time: 04/27/18  4:00 PM  Result Value Ref Range   Color, Urine YELLOW YELLOW   APPearance CLEAR CLEAR   Specific Gravity, Urine 1.012 1.005 - 1.030   pH 7.0 5.0 - 8.0   Glucose, UA NEGATIVE NEGATIVE mg/dL   Hgb urine dipstick NEGATIVE NEGATIVE   Bilirubin Urine NEGATIVE NEGATIVE   Ketones, ur NEGATIVE NEGATIVE mg/dL   Protein, ur NEGATIVE NEGATIVE mg/dL   Nitrite NEGATIVE NEGATIVE   Leukocytes, UA NEGATIVE NEGATIVE    Comment: Performed at [place] [address], [place], [place] [address]  Urine rapid drug screen (hosp performed)     Status: None   Collection Time: 04/27/18  4:00 PM  Result Value Ref Range   Opiates NONE DETECTED NONE DETECTED   Cocaine NONE DETECTED NONE DETECTED   Benzodiazepines NONE DETECTED NONE DETECTED   Amphetamines NONE DETECTED NONE DETECTED   Tetrahydrocannabinol NONE DETECTED NONE DETECTED   Barbiturates NONE DETECTED NONE DETECTED    Comment: (NOTE) DRUG SCREEN FOR MEDICAL PURPOSES ONLY.  IF CONFIRMATION IS NEEDED FOR ANY PURPOSE, NOTIFY LAB WITHIN 5 DAYS. LOWEST DETECTABLE LIMITS FOR URINE DRUG SCREEN Drug Class                     Cutoff (ng/mL) Amphetamine and metabolites    1000 Barbiturate and metabolites    200 Benzodiazepine                 937 Tricyclics and metabolites     300 Opiates and metabolites        300 Cocaine and metabolites        300 THC                             50 Performed at [facility], [place] [address] [place], [place] [address]   CBG monitoring, ED     Status: Abnormal   Collection Time: 04/27/18  5:01 PM  Result Value Ref Range   Glucose-Capillary 103 (H) 70 - 99 mg/dL   Comment 1 Notify RN    Comment 2 Document in Chart   CBG monitoring, ED     Status: Abnormal   Collection Time: 04/27/18  8:59 PM  Result Value Ref Range   Glucose-Capillary 102 (H) 70 - 99 mg/dL   Comment 1 Notify RN    Comment 2 Document in Chart   CBG monitoring, ED     Status: None   Collection Time: 04/28/18  8:17 AM  Result Value Ref Range   Glucose-Capillary 88 70 - 99 mg/dL    Current Facility-Administered Medications  Medication Dose Route Frequency Provider Last Rate Last Dose  . acetaminophen (TYLENOL) tablet 650 mg  650 mg Oral Q4H PRN Nanavati, Ankit, MD      . carvedilol (COREG) tablet 12.5 mg  12.5 mg Oral BID WC Lacretia Leigh, MD   12.5 mg at 04/28/18 0900  . cloNIDine (CATAPRES) tablet 0.1 mg  0.1 mg Oral BID Lacretia Leigh, MD   0.1 mg at 04/28/18 0901  .  divalproex (DEPAKOTE SPRINKLE) capsule 750 mg  750 mg Oral BID Lacretia Leigh, MD   750 mg at 04/28/18 0901  . haloperidol (HALDOL) tablet 2 mg  2 mg Oral BID PRN Lacretia Leigh, MD      . insulin aspart (novoLOG) injection 0-15 Units  0-15 Units Subcutaneous TID WC Lacretia Leigh, MD      . insulin aspart (novoLOG) injection 0-5 Units  0-5 Units Subcutaneous QHS Lacretia Leigh, MD      . levothyroxine (SYNTHROID, LEVOTHROID) tablet 50 mcg  50 mcg Oral QAC breakfast Lacretia Leigh, MD   50 mcg at 04/28/18 [phone]  . QUEtiapine (SEROQUEL) tablet 200 mg  200 mg Oral QHS Ember Henrikson, MD      . QUEtiapine (SEROQUEL) tablet 25 mg  25 mg Oral Q1400 Niti Leisure, MD   25 mg at 04/27/18 1346  . sodium chloride 0.9 % bolus 500 mL  500 mL Intravenous Once Varney Biles, MD   Stopped at 04/27/18 0524  . tamsulosin (FLOMAX) capsule 0.4 mg  0.4 mg Oral q1800 Lacretia Leigh, MD   0.4 mg at 04/27/18 1809   Current Outpatient Medications  Medication Sig Dispense Refill  . atorvastatin (LIPITOR) 20 MG tablet Take 20 mg by mouth at bedtime.    . Calcium Carbonate-Vit D-Min (CALTRATE 600+D PLUS MINERALS) 600-800 MG-UNIT CHEW Chew 1 tablet by mouth daily.    . carvedilol (COREG) 12.5 MG tablet Take 12.5 mg by mouth 2 (two) times daily with a meal.    . cloNIDine (CATAPRES) 0.1 MG tablet Take 0.1 mg by mouth 2 (two) times daily.    . divalproex (DEPAKOTE SPRINKLE) 125 MG capsule Take 750 mg by mouth 2 (two) times daily.    . folic acid (FOLVITE) 1 MG tablet Take 1 mg by mouth daily.    . Lacosamide 100 MG TABS Take 100 mg by mouth 2 (two) times daily.    Marland Kitchen levothyroxine (SYNTHROID, LEVOTHROID) 50 MCG tablet Take 50 mcg by mouth daily before breakfast.    . LORazepam (ATIVAN) 0.5 MG tablet Take 0.5 mg by mouth every 6 (six) hours as needed for anxiety.    Marland Kitchen LORazepam (ATIVAN) 1 MG tablet Take 1 mg by mouth at bedtime.    Marland Kitchen OLANZapine (ZYPREXA) 10 MG tablet Take 1 tablet (10 mg total) by mouth daily at 8 pm. 30 tablet 3  . QUEtiapine (SEROQUEL) 100 MG tablet Take 100 mg by mouth at bedtime.    Marland Kitchen QUEtiapine (SEROQUEL) 25 MG tablet Take 12.5 mg by mouth daily at 2 PM.    . tamsulosin (FLOMAX) 0.4 MG CAPS capsule Take 0.4 mg by mouth daily.    Marland Kitchen thiamine (VITAMIN B-1) 100 MG tablet Take 100 mg by mouth daily.    . Vitamin D, Ergocalciferol, (DRISDOL) 1.25 MG (50000 UT) CAPS capsule Take 50,000 Units by mouth every Friday.    . feeding supplement, ENSURE ENLIVE, (ENSURE ENLIVE) LIQD Take 237 mLs by mouth 2 (two) times daily between meals. (Patient not taking: Reported on 04/27/2018) 237 mL 12  . haloperidol (HALDOL) 2 MG tablet Take 1 tablet (2 mg total) by mouth 2 (two) times daily as needed for agitation. (Patient not taking: Reported on 04/27/2018) 10 tablet 0  . insulin aspart (NOVOLOG) 100 UNIT/ML injection Inject 3 Units into the skin 3 (three) times daily with  meals. (Patient not taking: Reported on 04/27/2018) 10 mL 11  . insulin glargine (LANTUS) 100 UNIT/ML injection Inject 0.15 mLs (15 Units total)  into the skin 2 (two) times daily. (Patient not taking: Reported on 04/27/2018) 10 mL 11  . predniSONE (DELTASONE) 10 MG tablet Take 1 tablet (10 mg total) by mouth daily with breakfast. (Patient not taking: Reported on 04/27/2018) 30 tablet 1    Musculoskeletal: Strength & Muscle Tone: not tested Gait & Station: normal Patient leans: N/A  Psychiatric Specialty Exam: Physical Exam  Psychiatric: His affect is angry and blunt. His speech is delayed. He is agitated and combative. Thought content is paranoid and delusional. Cognition and memory are impaired. He expresses impulsivity.    Review of Systems  Constitutional: Negative.   HENT: Negative.   Eyes: Negative.   Respiratory: Negative.   Cardiovascular: Negative.   Gastrointestinal: Negative.   Genitourinary: Negative.   Musculoskeletal: Negative.   Skin: Negative.   Endo/Heme/Allergies: Negative.   Psychiatric/Behavioral: Positive for hallucinations and memory loss. The patient has insomnia.     Blood pressure (!) 146/70, pulse (!) 52, temperature 97.9 F (36.6 C), temperature source Oral, resp. rate 16, SpO2 100 %.There is no height or weight on file to calculate BMI.  General Appearance: Casual  Eye Contact:  Fair  Speech:  Clear and Coherent  Volume:  Increased  Mood:  Irritable  Affect:  Labile  Thought Process:  Disorganized  Orientation:  Other:  only to place and person  Thought Content:  Illogical and Paranoid Ideation  Suicidal Thoughts:  No  Homicidal Thoughts:  No  Memory:  Immediate;   Fair Recent;   Poor Remote;   Poor  Judgement:  Impaired  Insight:  Lacking  Psychomotor Activity:  Increased  Concentration:  Concentration: Poor and Attention Span: Poor  Recall:  Poor  Fund of Knowledge:  unable to assess  Language:  Fair  Akathisia:  No  Handed:  Right   AIMS (if indicated):     Assets:  Social Support  ADL's:  Impaired  Cognition:  Impaired,  Mild  Sleep:   poor     Treatment Plan Summary: Daily contact with patient to assess and evaluate symptoms and progress in treatment and Medication management  -Continue Depakote and Seroquel for mood and delusions  Disposition: Recommend psychiatric Inpatient admission when medically cleared. Refer to [facility] for stabilization  Corena Pilgrim, MD 04/28/2018 11:03 AM

## 2018-04-29 DIAGNOSIS — F0391 Unspecified dementia with behavioral disturbance: Secondary | ICD-10-CM

## 2018-04-29 LAB — CBG MONITORING, ED
GLUCOSE-CAPILLARY: 123 mg/dL — AB (ref 70–99)
Glucose-Capillary: 29 mg/dL — CL (ref 70–99)
Glucose-Capillary: 118 mg/dL — ABNORMAL HIGH (ref 70–99)
Glucose-Capillary: 86 mg/dL (ref 70–99)
Glucose-Capillary: 98 mg/dL (ref 70–99)
Glucose-Capillary: 164 mg/dL — ABNORMAL HIGH (ref 70–99)

## 2018-04-29 MED ORDER — LORAZEPAM 0.5 MG PO TABS
0.5000 mg | ORAL_TABLET | Freq: Four times a day (QID) | ORAL | Status: DC | PRN
  Administered 2018-05-01: 0.5 mg via ORAL
  Filled 2018-04-29: qty 1

## 2018-04-29 MED ORDER — VITAMIN B-1 100 MG PO TABS
100.0000 mg | ORAL_TABLET | Freq: Every day | ORAL | Status: DC
  Administered 2018-04-30 – 2018-05-01 (×2): 100 mg via ORAL
  Filled 2018-04-29 (×2): qty 1

## 2018-04-29 MED ORDER — LACOSAMIDE 50 MG PO TABS
100.0000 mg | ORAL_TABLET | Freq: Two times a day (BID) | ORAL | Status: DC
  Administered 2018-04-29 – 2018-05-01 (×3): 100 mg via ORAL
  Filled 2018-04-29 (×5): qty 2

## 2018-04-29 MED ORDER — LORAZEPAM 1 MG PO TABS
1.0000 mg | ORAL_TABLET | Freq: Every day | ORAL | Status: DC
  Administered 2018-04-29: 1 mg via ORAL
  Filled 2018-04-29 (×2): qty 1

## 2018-04-29 NOTE — ED Notes (Addendum)
Pt becoming agitated and beginning to wander. Attempted to enter into another Pts room. Was calmly asked to return to his room and began to becoming visible more agitated. Asked for no-one to come into his room. Refusing vitals, refusing CBG and has repeatedly refused medications.

## 2018-04-29 NOTE — ED Notes (Signed)
Dr. Wilson Singer informed of Pt CBG. Pt provided 12oz Orange juice and breakfast tray, awake alert and non symptomatic at this assessment. Advised to make sure Pt eats and drinks meal and beverages provided, hold any insulin coverage, and recheck CBG q-30 until CBG above 80

## 2018-04-29 NOTE — ED Notes (Signed)
Pt refuses to take any meds offered

## 2018-04-29 NOTE — ED Notes (Addendum)
Pt becoming agitated and attempting to wander and uncooperative refusing vitals and CBG

## 2018-04-29 NOTE — Discharge Instructions (Addendum)
For your behavioral health needs, you are advised to follow up with an outpatient psychiatrist.  Contact Vincent Izediuno, MD, at your earliest opportunity to ask about scheduling an initial appointment: ° °     Vincent Izediuno, MD °     [facility] °     [address] °     [place], [place] [address] °     [phone] ° °

## 2018-04-29 NOTE — Consult Note (Addendum)
[place] Psychiatry Consult   Reason for Consult: aggressive behavior Referring Physician:  EDP Patient Identification: Andrew Burton MRN:  [id number] Principal Diagnosis: Dementia with behavioral disturbance ([place]) Diagnosis:  Principal Problem:   Dementia with behavioral disturbance ([place])   Total Time spent with patient: 30 minutes  Subjective:   Andrew Burton is a 72 y.o. male patient admitted with aggression.  HPI:  73 y.o. male with history of Cerebral Amyloid angiopathy, Hypertension, IDDM, Renal insufficiency and Dementia who was brought under IVC due to increased physical, verbal aggression and hostility towards people-family and caregiver. He was experiencing some paranoia and irritability.  His behaviors have improved and more directable. He was seen at [facility] ED few days ago for similar complaint. He will benefit from inpatient admission for stabilization.  Past Psychiatric History: as above  Risk to Self: Suicidal Ideation: (UTA) Suicidal Intent: (UTA) Is patient at risk for suicide?: No Suicidal Plan?: (UTA) Access to Means: (UTA) What has been your use of drugs/alcohol within the last 12 months?: (UTA) How many times?: (UTA) Other Self Harm Risks: (UTA) Triggers for Past Attempts: (UTA) Intentional Self Injurious Behavior: (UTA) Risk to Others: Homicidal Ideation: (UTA) Thoughts of Harm to Others: (UTA) Current Homicidal Intent: (UTA) Current Homicidal Plan: (UTA) Access to Homicidal Means: (UTA) Identified Victim: (UTA) History of harm to others?: Yes Assessment of Violence: On admission Violent Behavior Description: Threats to staff and family  Does patient have access to weapons?: No Criminal Charges Pending?: No Does patient have a court date: No Prior Inpatient Therapy: Prior Inpatient Therapy: No Prior Outpatient Therapy: Prior Outpatient Therapy: No Does patient have an ACCT team?: No Does patient have Intensive In-House Services?  : No Does  patient have [facility] services? : No Does patient have P4CC services?: No  Past Medical History:  Past Medical History:  Diagnosis Date  . Alcohol abuse   . Brain tumor (benign) ([place])   . Cocaine abuse ([place])   . Dementia ([place])   . Hepatitis C   . Hypertension    No past surgical history on file. Family History:  Family History  Problem Relation Age of Onset  . Obesity Other    Family Psychiatric  History: None per chart review.  Social History:  Social History   Substance and Sexual Activity  Alcohol Use Not Currently   Comment: former alcoholic      Social History   Substance and Sexual Activity  Drug Use Not on file    Social History   Socioeconomic History  . Marital status: Single    Spouse name: Not on file  . Number of children: Not on file  . Years of education: Not on file  . Highest education level: Not on file  Occupational History  . Not on file  Social Needs  . Financial resource strain: Not on file  . Food insecurity:    Worry: Not on file    Inability: Not on file  . Transportation needs:    Medical: Not on file    Non-medical: Not on file  Tobacco Use  . Smoking status: Current Every Day Smoker    Types: Cigarettes, Pipe, Cigars, E-cigarettes  Substance and Sexual Activity  . Alcohol use: Not Currently    Comment: former alcoholic   . Drug use: Not on file  . Sexual activity: Not on file  Lifestyle  . Physical activity:    Days per week: Not on file    Minutes per session: Not on  file  . Stress: Not on file  Relationships  . Social connections:    Talks on phone: Not on file    Gets together: Not on file    Attends religious service: Not on file    Active member of club or organization: Not on file    Attends meetings of clubs or organizations: Not on file    Relationship status: Not on file  Other Topics Concern  . Not on file  Social History Narrative  . Not on file   Additional Social History: N/A    Allergies:  No Known  Allergies  Labs:  Results for orders placed or performed during the hospital encounter of 04/27/18 (from the past 48 hour(s))  CBG monitoring, ED     Status: Abnormal   Collection Time: 04/27/18 12:50 PM  Result Value Ref Range   Glucose-Capillary 184 (H) 70 - 99 mg/dL  Urinalysis, Routine w reflex microscopic     Status: None   Collection Time: 04/27/18  4:00 PM  Result Value Ref Range   Color, Urine YELLOW YELLOW   APPearance CLEAR CLEAR   Specific Gravity, Urine 1.012 1.005 - 1.030   pH 7.0 5.0 - 8.0   Glucose, UA NEGATIVE NEGATIVE mg/dL   Hgb urine dipstick NEGATIVE NEGATIVE   Bilirubin Urine NEGATIVE NEGATIVE   Ketones, ur NEGATIVE NEGATIVE mg/dL   Protein, ur NEGATIVE NEGATIVE mg/dL   Nitrite NEGATIVE NEGATIVE   Leukocytes, UA NEGATIVE NEGATIVE    Comment: Performed at [facility], [place] [address] [place], [place] [address]  Urine rapid drug screen (hosp performed)     Status: None   Collection Time: 04/27/18  4:00 PM  Result Value Ref Range   Opiates NONE DETECTED NONE DETECTED   Cocaine NONE DETECTED NONE DETECTED   Benzodiazepines NONE DETECTED NONE DETECTED   Amphetamines NONE DETECTED NONE DETECTED   Tetrahydrocannabinol NONE DETECTED NONE DETECTED   Barbiturates NONE DETECTED NONE DETECTED    Comment: (NOTE) DRUG SCREEN FOR MEDICAL PURPOSES ONLY.  IF CONFIRMATION IS NEEDED FOR ANY PURPOSE, NOTIFY LAB WITHIN 5 DAYS. LOWEST DETECTABLE LIMITS FOR URINE DRUG SCREEN Drug Class                     Cutoff (ng/mL) Amphetamine and metabolites    1000 Barbiturate and metabolites    200 Benzodiazepine                 673 Tricyclics and metabolites     300 Opiates and metabolites        300 Cocaine and metabolites        300 THC                            50 Performed at [facility], [place] [address], [place], [place] [address]   CBG monitoring, ED     Status: Abnormal   Collection Time: 04/27/18  5:01 PM  Result Value Ref  Range   Glucose-Capillary 103 (H) 70 - 99 mg/dL   Comment 1 Notify RN    Comment 2 Document in Chart   CBG monitoring, ED     Status: Abnormal   Collection Time: 04/27/18  8:59 PM  Result Value Ref Range   Glucose-Capillary 102 (H) 70 - 99 mg/dL   Comment 1 Notify RN    Comment 2 Document in Chart   CBG monitoring, ED  Status: None   Collection Time: 04/28/18  8:17 AM  Result Value Ref Range   Glucose-Capillary 88 70 - 99 mg/dL  CBG monitoring, ED     Status: Abnormal   Collection Time: 04/28/18 12:41 PM  Result Value Ref Range   Glucose-Capillary 101 (H) 70 - 99 mg/dL  CBG monitoring, ED     Status: None   Collection Time: 04/28/18  4:14 PM  Result Value Ref Range   Glucose-Capillary 89 70 - 99 mg/dL  CBG monitoring, ED     Status: Abnormal   Collection Time: 04/28/18  9:59 PM  Result Value Ref Range   Glucose-Capillary 157 (H) 70 - 99 mg/dL  CBG monitoring, ED     Status: Abnormal   Collection Time: 04/29/18  8:06 AM  Result Value Ref Range   Glucose-Capillary 29 (LL) 70 - 99 mg/dL   Comment 1 Notify RN   CBG monitoring, ED     Status: None   Collection Time: 04/29/18  8:43 AM  Result Value Ref Range   Glucose-Capillary 86 70 - 99 mg/dL  CBG monitoring, ED     Status: Abnormal   Collection Time: 04/29/18  9:13 AM  Result Value Ref Range   Glucose-Capillary 118 (H) 70 - 99 mg/dL    Current Facility-Administered Medications  Medication Dose Route Frequency Provider Last Rate Last Dose  . acetaminophen (TYLENOL) tablet 650 mg  650 mg Oral Q4H PRN Nanavati, Ankit, MD      . carvedilol (COREG) tablet 12.5 mg  12.5 mg Oral BID WC Lacretia Leigh, MD   12.5 mg at 04/28/18 1811  . cloNIDine (CATAPRES) tablet 0.1 mg  0.1 mg Oral BID Lacretia Leigh, MD   0.1 mg at 04/28/18 2208  . divalproex (DEPAKOTE SPRINKLE) capsule 750 mg  750 mg Oral BID Lacretia Leigh, MD   750 mg at 04/28/18 2210  . haloperidol (HALDOL) tablet 2 mg  2 mg Oral BID PRN Lacretia Leigh, MD      . insulin  aspart (novoLOG) injection 0-15 Units  0-15 Units Subcutaneous TID WC Lacretia Leigh, MD      . insulin aspart (novoLOG) injection 0-5 Units  0-5 Units Subcutaneous QHS Lacretia Leigh, MD      . levothyroxine (SYNTHROID, LEVOTHROID) tablet 50 mcg  50 mcg Oral QAC breakfast Lacretia Leigh, MD   50 mcg at 04/29/18 0703  . QUEtiapine (SEROQUEL) tablet 200 mg  200 mg Oral QHS Akintayo, Mojeed, MD   200 mg at 04/28/18 2208  . QUEtiapine (SEROQUEL) tablet 25 mg  25 mg Oral Q1400 Akintayo, Mojeed, MD   25 mg at 04/27/18 1346  . sodium chloride 0.9 % bolus 500 mL  500 mL Intravenous Once Varney Biles, MD   Stopped at 04/27/18 0524  . tamsulosin (FLOMAX) capsule 0.4 mg  0.4 mg Oral q1800 Lacretia Leigh, MD   0.4 mg at 04/28/18 1811   Current Outpatient Medications  Medication Sig Dispense Refill  . atorvastatin (LIPITOR) 20 MG tablet Take 20 mg by mouth at bedtime.    . Calcium Carbonate-Vit D-Min (CALTRATE 600+D PLUS MINERALS) 600-800 MG-UNIT CHEW Chew 1 tablet by mouth daily.    . carvedilol (COREG) 12.5 MG tablet Take 12.5 mg by mouth 2 (two) times daily with a meal.    . cloNIDine (CATAPRES) 0.1 MG tablet Take 0.1 mg by mouth 2 (two) times daily.    . divalproex (DEPAKOTE SPRINKLE) 125 MG capsule Take 750 mg by mouth 2 (two) times daily.    Marland Kitchen  folic acid (FOLVITE) 1 MG tablet Take 1 mg by mouth daily.    . Lacosamide 100 MG TABS Take 100 mg by mouth 2 (two) times daily.    Marland Kitchen levothyroxine (SYNTHROID, LEVOTHROID) 50 MCG tablet Take 50 mcg by mouth daily before breakfast.    . LORazepam (ATIVAN) 0.5 MG tablet Take 0.5 mg by mouth every 6 (six) hours as needed for anxiety.    Marland Kitchen LORazepam (ATIVAN) 1 MG tablet Take 1 mg by mouth at bedtime.    Marland Kitchen OLANZapine (ZYPREXA) 10 MG tablet Take 1 tablet (10 mg total) by mouth daily at 8 pm. 30 tablet 3  . QUEtiapine (SEROQUEL) 100 MG tablet Take 100 mg by mouth at bedtime.    Marland Kitchen QUEtiapine (SEROQUEL) 25 MG tablet Take 12.5 mg by mouth daily at 2 PM.    .  tamsulosin (FLOMAX) 0.4 MG CAPS capsule Take 0.4 mg by mouth daily.    Marland Kitchen thiamine (VITAMIN B-1) 100 MG tablet Take 100 mg by mouth daily.    . Vitamin D, Ergocalciferol, (DRISDOL) 1.25 MG (50000 UT) CAPS capsule Take 50,000 Units by mouth every Friday.    . feeding supplement, ENSURE ENLIVE, (ENSURE ENLIVE) LIQD Take 237 mLs by mouth 2 (two) times daily between meals. (Patient not taking: Reported on 04/27/2018) 237 mL 12  . haloperidol (HALDOL) 2 MG tablet Take 1 tablet (2 mg total) by mouth 2 (two) times daily as needed for agitation. (Patient not taking: Reported on 04/27/2018) 10 tablet 0  . insulin aspart (NOVOLOG) 100 UNIT/ML injection Inject 3 Units into the skin 3 (three) times daily with meals. (Patient not taking: Reported on 04/27/2018) 10 mL 11  . insulin glargine (LANTUS) 100 UNIT/ML injection Inject 0.15 mLs (15 Units total) into the skin 2 (two) times daily. (Patient not taking: Reported on 04/27/2018) 10 mL 11  . predniSONE (DELTASONE) 10 MG tablet Take 1 tablet (10 mg total) by mouth daily with breakfast. (Patient not taking: Reported on 04/27/2018) 30 tablet 1    Musculoskeletal: Strength & Muscle Tone: not tested Gait & Station: normal Patient leans: N/A  Psychiatric Specialty Exam: Physical Exam  Nursing note and vitals reviewed. Constitutional: He is oriented to person, place, and time. He appears well-developed and well-nourished.  HENT:  Head: Normocephalic.  Neck: Normal range of motion.  Respiratory: Effort normal.  Musculoskeletal: Normal range of motion.  Neurological: He is alert and oriented to person, place, and time.  Psychiatric: His behavior is normal. His affect is blunt. His speech is delayed. Thought content is delusional. Cognition and memory are impaired. He expresses impulsivity.    Review of Systems  Constitutional: Negative.   HENT: Negative.   Eyes: Negative.   Respiratory: Negative.   Cardiovascular: Negative.   Gastrointestinal: Negative.    Genitourinary: Negative.   Musculoskeletal: Negative.   Skin: Negative.   Endo/Heme/Allergies: Negative.   Psychiatric/Behavioral: Positive for memory loss. The patient has insomnia.     Blood pressure 136/74, pulse 63, temperature 97.7 F (36.5 C), temperature source Oral, resp. rate 20, SpO2 100 %.There is no height or weight on file to calculate BMI.  General Appearance: Casual  Eye Contact:  Fair  Speech:  Clear and Coherent  Volume:  Normal  Mood:  Irritable  Affect:  Blunt  Thought Process:  Disorganized  Orientation:  Other:  only to place and person  Thought Content:  Illogical and Paranoid Ideation  Suicidal Thoughts:  No  Homicidal Thoughts:  No  Memory:  Immediate;  Fair Recent;   Poor Remote;   Poor  Judgement:  Impaired  Insight:  Lacking  Psychomotor Activity:  Normal  Concentration:  Concentration: Poor and Attention Span: Poor  Recall:  Poor  Fund of Knowledge:  unable to assess  Language:  Fair  Akathisia:  No  Handed:  Right  AIMS (if indicated):   N/A  Assets:  Social Support  ADL's:  Impaired  Cognition:  Impaired,  Mild  Sleep:   Poor     Treatment Plan Summary: Daily contact with patient to assess and evaluate symptoms and progress in treatment and Medication management  -Continue Depakote and Seroquel for mood and delusions  Disposition: Recommend psychiatric Inpatient admission when medically cleared. Refer to [facility] for stabilization  Waylan Boga, NP 04/29/2018 10:18 AM   Patient seen face-to-face for psychiatric evaluation, chart reviewed and case discussed with the physician extender and developed treatment plan. Reviewed the information documented and agree with the treatment plan.  Buford Dresser, DO 04/29/18 3:22 PM

## 2018-04-29 NOTE — Progress Notes (Addendum)
CSW received a call from pt's daughter Andrew Burton at ph: [phone] who stated she is currently at [facility] facility and that she is requesting the CSW to speak to the administrator at [facility] regarding pt's medication changes.    CSW spoke to Andrew Burton the memory care manager at ph: [phone] Fax: [phone] main facility number [phone].    Andrew Burton is aware, per the pt's daughter that pt is being referred for geropsyche inpatient but is assessing for down-the-road possible admission once pt is D/C'd from inpatient geropsyche treatment and is requesting referral informations.  Pt has dementia and is not A&OX4, per RN and is refusing all medications.    Pt's daughter provided verbal permission for the CSW to send information to [facility]. Pt's daughter acknowledges that pt is an unlikely candidate for admission into [facility] and asked that the CSW, "not send them anything crazy, please!"  CSW stated to pt's daughter that the CSW is ethically obligated to send complete referral with all progress notes as requested by [facility] and pt's daughter voiced undedrstanding.  Pt's daughter states that she has concerns about pt's medications and CSW provided pt's daughter with the pt's RN's number in [place].  CSW spoke to RN in ED TCU to update.  CSW will continue to follow for D/C needs.  Andrew Guild. Genesi Stefanko, LCSW, LCAS, CSI Clinical Social Worker Ph: [phone]

## 2018-04-29 NOTE — ED Notes (Signed)
Pt incontinent of urine, comfort measures given.

## 2018-04-29 NOTE — Progress Notes (Signed)
CSW called and spoke with patient's daughter, Shepherd Finnan [phone], regarding patient disposition. CSW explained patient was still being recommended for geropsych placement but that the psychiatry team felt patient had improved since this weekend and that he may be psychiatrically cleared tomorrow. [name] expressed to CSW that she could not take patient in if he was discharged as patient has been violent in the past and she has children in the home. Per [name], patient has been to two ALF's with memory cares in the past and was discharged due to aggression and inappropriate behavior. [name] requesting that CSW refer patient to group homes who can handle patient's behavior. CSW explained that group homes do not normally take patient's with dementia diagnoses. [name] requesting the various options for patient as [name] does not know what to do. CSW stated patient was still being recommended for geropsych placement and may be accepted to a facility. CSW explained she would gather up necessary resources and follow back up with [name] in the event patient was psych cleared. CSW will continue to follow.  Ollen Barges, [place] Work Department  [employer]  [phone]

## 2018-04-29 NOTE — Progress Notes (Addendum)
Information sent to [facility] as requested by pt's daughter.  Pt's daughter aware and voiced understanding that the [facility] psychiatrist's recommendation is inpatient geropsyche psychiatry at this time.  CSW will continue to follow for D/C needs.  Alphonse Guild. Judythe Postema, LCSW, LCAS, CSI Clinical Social Worker Ph: [phone]

## 2018-04-29 NOTE — BH Assessment (Addendum)
[facility] Assessment Progress Note  Per Andrew Dresser, DO, this pt requires psychiatric hospitalization at this time.  This Probation officer will seek placement for pt in a facility that provides specialty services for geriatric patients.  [name], [place] [phone]   Addendum:  The following facilities have been contacted to seek placement for this pt, with results as noted:  Beds available, information sent, decision pending:  [facility] (pt was previously declined, but will reconsider) [facility]   Declined:  [facility] (due to dementia diagnosis) [facility] (due to dementia diagnosis) [facility] (due to dementia diagnosis) [facility] (due to pt acuity; unable to reconsider today - at capacity)    At capacity:  [facility] [place], [place] Coordinator [phone]

## 2018-04-29 NOTE — ED Notes (Signed)
Pt has been calm and cooperative since shift change at 0700. There have been no noted verbal or physical outbursts.

## 2018-04-30 DIAGNOSIS — F0391 Unspecified dementia with behavioral disturbance: Secondary | ICD-10-CM | POA: Diagnosis not present

## 2018-04-30 LAB — CBG MONITORING, ED
Glucose-Capillary: 108 mg/dL — ABNORMAL HIGH (ref 70–99)
Glucose-Capillary: 88 mg/dL (ref 70–99)
Glucose-Capillary: 134 mg/dL — ABNORMAL HIGH (ref 70–99)
Glucose-Capillary: 132 mg/dL — ABNORMAL HIGH (ref 70–99)

## 2018-04-30 NOTE — Progress Notes (Signed)
CSW aware patient has been psychiatrically and medically cleared for discharge. CSW reached out to patient's daughter, Chantry Headen [phone], regarding patient disposition. Per [name], she reached out to [facility] who stated they could not accept patient. [name] expressed frustration with patient not being accepted anywhere due to his behaviors. CSW explained that she could assist patient's daughter in getting necessary forms filled out such as an FL2 and could reach out to various facilities to see if they have open beds but explained that patient could not be held in the ED for placement. [name] stated she could not pick up patient until after work at 9:30pm and then hung up on CSW. CSW to leave various resources for patient's daughter. CSW to update patient's EDP and RN.  Ollen Barges, [place] Work Department  [employer]  [phone]

## 2018-04-30 NOTE — ED Notes (Signed)
Pt has bed starting tomorrow at [facility]. Daughter will not pick up pt until tomorrow at 2pm

## 2018-04-30 NOTE — Progress Notes (Addendum)
5:00 PM [facility] able to get TB test results from [facility] (pt's previous placement). Per [facility], their fax machine is down. Requested CSW sent FL-2 via email. CSW sent FL-2 via secure email to [facility]. [facility] able to take pt mid-morning tomorrow.   CSW called to update pt's daughter. CSW explained to pt's daughter that she still needs to pick up pt for this evening and transport to [facility]. Pt's daughter stated she is walking into work. When asked pt's daughter did not say whether or not she was still picking pt up, phone disconnected. CSW called back pt's daughter's cell phone back and there was no answer.   4:10 PM [facility] CSW received phone call from Lemar Livings with [facility]. Pt accepted to [facility]. Pt must have TB test to be accepted.[address] Genevive Bi is reaching out to pt's previous placement, [facility] to see if they still have his TB test on file. Plan is for daughter to still pick pt up tonight. CSW will complete and fax FL-2 to [facility].   CSW spoke with pt's daughter, [name]. Updated her that [facility] has accepted pt. CSW explained that it is pending TB test and Stephan Minister is working on obtaining that from [facility]. Pt's daughter understands that pt still needs to be picked up from the ED.   3:21 [facility] CSW received phone call from [facility] with [place], [phone]. Sherle Poe Genevive Bi is coming to evaluated pt for their facility. CSW explained that pt is being picked up by daughter at 9:30 tonight and taken home with her. If accepted to [facility], pt can transition to [facility] from home.  CSW will continue to follow.   [name], [name]  [phone]

## 2018-04-30 NOTE — Progress Notes (Addendum)
9:52 PM CSW spoke with pt's daughter, [name]. [name] reported she will not be picking pt up tonight. CSW explained that pt does not need to remain in the hospital. Pt's daughter reports understanding, but stated that she will not pick him up tonight. Pt's daughter will transport pt tomorrow to [facility]. Pt's daughter works until 1:30 and will be able to transport him at 2, if [name] is unable to transport him earlier.   CSW updated RN. CSW left handoff for daytime CSW to follow up with in the morning.   [name], [name]  [phone]

## 2018-04-30 NOTE — BH Assessment (Addendum)
[facility] Assessment Progress Note  Per Buford Dresser, DO, this pt does not require psychiatric hospitalization at this time.  Pt presents under IVC initiated by pt's daughter, and upheld by Corena Pilgrim, MD, which Dr Mariea Clonts has rescinded.  Pt is psychiatrically cleared.  Ollen Barges, LCSW agrees to facilitate pt's return to the community.  Pt's nurse has been notified.  [name], MA Triage Specialist [phone]   Addendum:  At the request of Noah Delaine, this Probation officer entered referral information for Leanord Hawking, MD into pt's discharge instructions.  [name], [place] Coordinator [phone]

## 2018-04-30 NOTE — ED Notes (Signed)
Pt resting in bed. NAD noted. Equal rise and fall of chest noted. Sitter remains at bedside.

## 2018-04-30 NOTE — ED Notes (Signed)
Per Dorisann Frames- Pt is psychiatrically cleared at this time. Social Work is facilitating d/c

## 2018-04-30 NOTE — ED Notes (Addendum)
Pt was accepted into [facility]. Pt still to be picked up by daughter tonight and daughter to facilitate transport to facility.

## 2018-04-30 NOTE — Consult Note (Addendum)
[facility] Psych ED Progress Note   Reason for Consult: aggressive behavior Referring Physician:  EDP Patient Identification: Andrew Burton MRN:  [id number] Principal Diagnosis: Dementia with behavioral disturbance ([place]) Diagnosis:  Principal Problem:   Dementia with behavioral disturbance ([place])   Total Time spent with patient: 20 minutes  Subjective:   Andrew Burton is a 72 y.o. male patient admitted with aggression.  HPI:  72 y.o. male with history of Cerebral Amyloid angiopathy, Hypertension, IDDM, Renal insufficiency and Dementia who was brought under IVC due to increased physical, verbal aggression and hostility towards people-family and caregiver. He was experiencing some paranoia and irritability.  His behaviors have improved and more directable. He was seen at [facility] ED few days ago for similar complaint.  During the evaluation patient was alert and oriented x1, calm cooperative and pleasant. He states he is confused but feels better. He reports his sleep has improved and his appetite continues to get better. His responses are appropriate at this time, despite being drowsy and awaken from sleep. He continues to present with some degree of confusion however this can be contributed to his cerebral angitis/dementia. He has not exhibited any additional aggression, disruptive behaviors or required any additional restraints in 18 hours.   Past Psychiatric History: as above  Risk to Self: None. Denies SI.  Risk to Others: None. Denies HI.  Prior Inpatient Therapy: Prior Inpatient Therapy: No Prior Outpatient Therapy: Prior Outpatient Therapy: No Does patient have an ACCT team?: No Does patient have Intensive In-House Services?  : No Does patient have [facility] services? : No Does patient have P4CC services?: No  Past Medical History:  Past Medical History:  Diagnosis Date  . Alcohol abuse   . Brain tumor (benign) ([place])   . Cocaine abuse ([place])   . Dementia ([place])   . Hepatitis C   .  Hypertension    No past surgical history on file. Family History:  Family History  Problem Relation Age of Onset  . Obesity Other    Family Psychiatric  History: None per chart review.  Social History:  Social History   Substance and Sexual Activity  Alcohol Use Not Currently   Comment: former alcoholic      Social History   Substance and Sexual Activity  Drug Use Not on file    Social History   Socioeconomic History  . Marital status: Single    Spouse name: Not on file  . Number of children: Not on file  . Years of education: Not on file  . Highest education level: Not on file  Occupational History  . Not on file  Social Needs  . Financial resource strain: Not on file  . Food insecurity:    Worry: Not on file    Inability: Not on file  . Transportation needs:    Medical: Not on file    Non-medical: Not on file  Tobacco Use  . Smoking status: Current Every Day Smoker    Types: Cigarettes, Pipe, Cigars, E-cigarettes  Substance and Sexual Activity  . Alcohol use: Not Currently    Comment: former alcoholic   . Drug use: Not on file  . Sexual activity: Not on file  Lifestyle  . Physical activity:    Days per week: Not on file    Minutes per session: Not on file  . Stress: Not on file  Relationships  . Social connections:    Talks on phone: Not on file    Gets together: Not on  file    Attends religious service: Not on file    Active member of club or organization: Not on file    Attends meetings of clubs or organizations: Not on file    Relationship status: Not on file  Other Topics Concern  . Not on file  Social History Narrative  . Not on file   Additional Social History: N/A    Allergies:  No Known Allergies  Labs:  Results for orders placed or performed during the hospital encounter of 04/27/18 (from the past 48 hour(s))  CBG monitoring, ED     Status: Abnormal   Collection Time: 04/28/18 12:41 PM  Result Value Ref Range   Glucose-Capillary 101  (H) 70 - 99 mg/dL  CBG monitoring, ED     Status: None   Collection Time: 04/28/18  4:14 PM  Result Value Ref Range   Glucose-Capillary 89 70 - 99 mg/dL  CBG monitoring, ED     Status: Abnormal   Collection Time: 04/28/18  9:59 PM  Result Value Ref Range   Glucose-Capillary 157 (H) 70 - 99 mg/dL  CBG monitoring, ED     Status: Abnormal   Collection Time: 04/29/18  8:06 AM  Result Value Ref Range   Glucose-Capillary 29 (LL) 70 - 99 mg/dL   Comment 1 Notify RN   CBG monitoring, ED     Status: None   Collection Time: 04/29/18  8:43 AM  Result Value Ref Range   Glucose-Capillary 86 70 - 99 mg/dL  CBG monitoring, ED     Status: Abnormal   Collection Time: 04/29/18  9:13 AM  Result Value Ref Range   Glucose-Capillary 118 (H) 70 - 99 mg/dL  CBG monitoring, ED     Status: None   Collection Time: 04/29/18 12:00 PM  Result Value Ref Range   Glucose-Capillary 98 70 - 99 mg/dL   Comment 1 Notify RN   CBG monitoring, ED     Status: Abnormal   Collection Time: 04/29/18  5:11 PM  Result Value Ref Range   Glucose-Capillary 123 (H) 70 - 99 mg/dL   Comment 1 Notify RN   CBG monitoring, ED     Status: Abnormal   Collection Time: 04/29/18 10:14 PM  Result Value Ref Range   Glucose-Capillary 164 (H) 70 - 99 mg/dL  CBG monitoring, ED     Status: Abnormal   Collection Time: 04/30/18  7:51 AM  Result Value Ref Range   Glucose-Capillary 108 (H) 70 - 99 mg/dL   Comment 1 Notify RN     Current Facility-Administered Medications  Medication Dose Route Frequency Provider Last Rate Last Dose  . acetaminophen (TYLENOL) tablet 650 mg  650 mg Oral Q4H PRN Varney Biles, MD   650 mg at 04/30/18 0345  . carvedilol (COREG) tablet 12.5 mg  12.5 mg Oral BID WC Lacretia Leigh, MD   12.5 mg at 04/30/18 0750  . cloNIDine (CATAPRES) tablet 0.1 mg  0.1 mg Oral BID Lacretia Leigh, MD   0.1 mg at 04/30/18 0956  . divalproex (DEPAKOTE SPRINKLE) capsule 750 mg  750 mg Oral BID Lacretia Leigh, MD   750 mg at  04/30/18 1062  . haloperidol (HALDOL) tablet 2 mg  2 mg Oral BID PRN Lacretia Leigh, MD      . insulin aspart (novoLOG) injection 0-15 Units  0-15 Units Subcutaneous TID WC Lacretia Leigh, MD      . insulin aspart (novoLOG) injection 0-5 Units  0-5 Units Subcutaneous QHS Zenia Resides,  Elberta Fortis, MD      . lacosamide (VIMPAT) tablet 100 mg  100 mg Oral BID Virgel Manifold, MD   100 mg at 04/30/18 0955  . levothyroxine (SYNTHROID, LEVOTHROID) tablet 50 mcg  50 mcg Oral QAC breakfast Lacretia Leigh, MD   50 mcg at 04/30/18 0750  . LORazepam (ATIVAN) tablet 0.5 mg  0.5 mg Oral Q6H PRN Virgel Manifold, MD      . LORazepam (ATIVAN) tablet 1 mg  1 mg Oral QHS Virgel Manifold, MD   1 mg at 04/29/18 2305  . QUEtiapine (SEROQUEL) tablet 200 mg  200 mg Oral QHS Akintayo, Mojeed, MD   200 mg at 04/29/18 2305  . QUEtiapine (SEROQUEL) tablet 25 mg  25 mg Oral Q1400 Akintayo, Mojeed, MD   25 mg at 04/27/18 1346  . sodium chloride 0.9 % bolus 500 mL  500 mL Intravenous Once Varney Biles, MD   Stopped at 04/27/18 0524  . tamsulosin (FLOMAX) capsule 0.4 mg  0.4 mg Oral q1800 Lacretia Leigh, MD   0.4 mg at 04/29/18 2311  . thiamine (VITAMIN B-1) tablet 100 mg  100 mg Oral Daily Virgel Manifold, MD   100 mg at 04/30/18 6378   Current Outpatient Medications  Medication Sig Dispense Refill  . atorvastatin (LIPITOR) 20 MG tablet Take 20 mg by mouth at bedtime.    . Calcium Carbonate-Vit D-Min (CALTRATE 600+D PLUS MINERALS) 600-800 MG-UNIT CHEW Chew 1 tablet by mouth daily.    . carvedilol (COREG) 12.5 MG tablet Take 12.5 mg by mouth 2 (two) times daily with a meal.    . cloNIDine (CATAPRES) 0.1 MG tablet Take 0.1 mg by mouth 2 (two) times daily.    . divalproex (DEPAKOTE SPRINKLE) 125 MG capsule Take 750 mg by mouth 2 (two) times daily.    . folic acid (FOLVITE) 1 MG tablet Take 1 mg by mouth daily.    . Lacosamide 100 MG TABS Take 100 mg by mouth 2 (two) times daily.    Marland Kitchen levothyroxine (SYNTHROID, LEVOTHROID) 50 MCG tablet  Take 50 mcg by mouth daily before breakfast.    . LORazepam (ATIVAN) 0.5 MG tablet Take 0.5 mg by mouth every 6 (six) hours as needed for anxiety.    Marland Kitchen LORazepam (ATIVAN) 1 MG tablet Take 1 mg by mouth at bedtime.    Marland Kitchen OLANZapine (ZYPREXA) 10 MG tablet Take 1 tablet (10 mg total) by mouth daily at 8 pm. 30 tablet 3  . QUEtiapine (SEROQUEL) 100 MG tablet Take 100 mg by mouth at bedtime.    Marland Kitchen QUEtiapine (SEROQUEL) 25 MG tablet Take 12.5 mg by mouth daily at 2 PM.    . tamsulosin (FLOMAX) 0.4 MG CAPS capsule Take 0.4 mg by mouth daily.    Marland Kitchen thiamine (VITAMIN B-1) 100 MG tablet Take 100 mg by mouth daily.    . Vitamin D, Ergocalciferol, (DRISDOL) 1.25 MG (50000 UT) CAPS capsule Take 50,000 Units by mouth every Friday.    . feeding supplement, ENSURE ENLIVE, (ENSURE ENLIVE) LIQD Take 237 mLs by mouth 2 (two) times daily between meals. (Patient not taking: Reported on 04/27/2018) 237 mL 12  . haloperidol (HALDOL) 2 MG tablet Take 1 tablet (2 mg total) by mouth 2 (two) times daily as needed for agitation. (Patient not taking: Reported on 04/27/2018) 10 tablet 0  . insulin aspart (NOVOLOG) 100 UNIT/ML injection Inject 3 Units into the skin 3 (three) times daily with meals. (Patient not taking: Reported on 04/27/2018) 10 mL 11  .  insulin glargine (LANTUS) 100 UNIT/ML injection Inject 0.15 mLs (15 Units total) into the skin 2 (two) times daily. (Patient not taking: Reported on 04/27/2018) 10 mL 11  . predniSONE (DELTASONE) 10 MG tablet Take 1 tablet (10 mg total) by mouth daily with breakfast. (Patient not taking: Reported on 04/27/2018) 30 tablet 1    Musculoskeletal: Strength & Muscle Tone: not tested Gait & Station: normal Patient leans: N/A  Psychiatric Specialty Exam: Physical Exam  Nursing note and vitals reviewed. Constitutional: He appears well-developed and well-nourished.  HENT:  Head: Normocephalic and atraumatic.  Neck: Normal range of motion.  Respiratory: Effort normal.   Musculoskeletal: Normal range of motion.  Neurological: He is alert.  Oriented to person only.  Psychiatric: His behavior is normal. Thought content normal. His affect is blunt. His speech is delayed. Cognition and memory are impaired. He expresses impulsivity.    Review of Systems  Constitutional: Negative.   HENT: Negative.   Eyes: Negative.   Respiratory: Negative.   Cardiovascular: Negative.   Gastrointestinal: Negative.   Genitourinary: Negative.   Musculoskeletal: Negative.   Skin: Negative.   Endo/Heme/Allergies: Negative.   Psychiatric/Behavioral: Positive for memory loss. The patient has insomnia.     Blood pressure 126/87, pulse 73, temperature (!) 97.5 F (36.4 C), temperature source Oral, resp. rate 18, SpO2 100 %.There is no height or weight on file to calculate BMI.  General Appearance: Casual  Eye Contact:  Fair  Speech:  Clear and Coherent  Volume:  Normal  Mood:  better and appropriate  Affect:  Blunt  Thought Process:  Coherent and Descriptions of Associations: Intact  Orientation:  Other:  only to person  Thought Content:  Illogical and Rumination  Suicidal Thoughts:  No  Homicidal Thoughts:  No  Memory:  Immediate;   Poor Recent;   Poor Remote;   Poor  Judgement:  Impaired  Insight:  Lacking  Psychomotor Activity:  Normal  Concentration:  Concentration: Poor and Attention Span: Poor  Recall:  Poor  Fund of Knowledge:  Poor  Language:  Fair  Akathisia:  No  Handed:  Right  AIMS (if indicated):   N/A  Assets:  Social Support  ADL's:  Impaired  Cognition:  Impaired,  Mild  Sleep:   Poor     Treatment Plan Summary: Daily contact with patient to assess and evaluate symptoms and progress in treatment and Medication management.  -Continue Depakote and Seroquel for mood and delusions and aggression.   Disposition: No evidence of imminent risk to self or others at present.   Patient does not meet criteria for psychiatric inpatient  admission. Supportive therapy provided about ongoing stressors. Discussed crisis plan, support from social network, calling 911, coming to the Emergency Department, and calling Suicide Hotline. WIll psych clear at this time. LCSW will continue to work with family and outpatient for further services and placement in Memory care unit going forward.   Suella Broad, FNP 04/30/2018 11:19 AM   Patient seen face-to-face for psychiatric evaluation, chart reviewed and case discussed with the physician extender and developed treatment plan. Reviewed the information documented and agree with the treatment plan.  Buford Dresser, DO 04/30/18 6:00 PM

## 2018-04-30 NOTE — ED Notes (Signed)
Pt consumed apple sauce with crushed evening medicines.

## 2018-04-30 NOTE — ED Notes (Signed)
Per SW: Pt will be a boarder and is awaiting placement into a facility. Per SW: pts daughter who is the pts caretaker is unable to take the pt back to her home due to his aggression and her having 2 small children in the home.

## 2018-04-30 NOTE — NC FL2 (Signed)
[place] LEVEL OF CARE SCREENING TOOL     IDENTIFICATION  Patient Name: Andrew Burton Birthdate: [birth year] Sex: male Admission Date (Current Location): 04/27/2018  [facility] and [place] Number:  Herbalist and Address:  [facility] [place] [address] [place],  [address]      Provider Number: [id number]  Attending Physician Name and Address:  Default, Provider, MD  Relative Name and Phone Number:  Zong Mcquarrie [phone]    Current Level of Care: Hospital Recommended Level of Care: Memory Care Prior Approval Number:    Date Approved/Denied: 01/23/18 PASRR Number: [id number]  Discharge Plan: Home    Current Diagnoses: Patient Active Problem List   Diagnosis Date Noted  . Dementia with behavioral disturbance ([place]) 04/27/2018  . Renal insufficiency 01/22/2018  . Hyperkalemia 01/22/2018  . Acute lower UTI 01/22/2018  . Insulin-requiring or dependent type II diabetes mellitus ([place]) 01/22/2018  . Uncontrolled type 2 DM with hyperosmolar nonketotic hyperglycemia ([place]) 01/22/2018  . Cerebral amyloid angiopathy ([place]) 01/22/2018  . Restlessness and agitation 01/22/2018  . Essential hypertension 01/22/2018  . Acute cystitis without hematuria     Orientation RESPIRATION BLADDER Height & Weight     Self, Place, Time  Normal Incontinent Weight:   Height:     BEHAVIORAL SYMPTOMS/MOOD NEUROLOGICAL BOWEL NUTRITION STATUS        (See AVS)  AMBULATORY STATUS COMMUNICATION OF NEEDS Skin       Normal                       Personal Care Assistance Level of Assistance  Bathing, Feeding, Dressing Bathing Assistance: Limited assistance Feeding assistance: Limited assistance Dressing Assistance: Limited assistance     Functional Limitations Info             SPECIAL CARE FACTORS FREQUENCY                       Contractures      Additional Factors Info  Code Status, Allergies Code Status Info:  Full Allergies Info: None           Current Medications (04/30/2018):  This is the current hospital active medication list Current Facility-Administered Medications  Medication Dose Route Frequency Provider Last Rate Last Dose  . acetaminophen (TYLENOL) tablet 650 mg  650 mg Oral Q4H PRN Varney Biles, MD   650 mg at 04/30/18 0345  . carvedilol (COREG) tablet 12.5 mg  12.5 mg Oral BID WC Lacretia Leigh, MD   12.5 mg at 04/30/18 0750  . cloNIDine (CATAPRES) tablet 0.1 mg  0.1 mg Oral BID Lacretia Leigh, MD   0.1 mg at 04/30/18 0956  . divalproex (DEPAKOTE SPRINKLE) capsule 750 mg  750 mg Oral BID Lacretia Leigh, MD   750 mg at 04/30/18 3818  . haloperidol (HALDOL) tablet 2 mg  2 mg Oral BID PRN Lacretia Leigh, MD      . insulin aspart (novoLOG) injection 0-15 Units  0-15 Units Subcutaneous TID WC Lacretia Leigh, MD      . insulin aspart (novoLOG) injection 0-5 Units  0-5 Units Subcutaneous QHS Lacretia Leigh, MD      . lacosamide (VIMPAT) tablet 100 mg  100 mg Oral BID Virgel Manifold, MD   100 mg at 04/30/18 0955  . levothyroxine (SYNTHROID, LEVOTHROID) tablet 50 mcg  50 mcg Oral QAC breakfast Lacretia Leigh, MD   50 mcg at 04/30/18 0750  . LORazepam (ATIVAN)  tablet 0.5 mg  0.5 mg Oral Q6H PRN Virgel Manifold, MD      . LORazepam (ATIVAN) tablet 1 mg  1 mg Oral QHS Virgel Manifold, MD   1 mg at 04/29/18 2305  . QUEtiapine (SEROQUEL) tablet 200 mg  200 mg Oral QHS Akintayo, Mojeed, MD   200 mg at 04/29/18 2305  . QUEtiapine (SEROQUEL) tablet 25 mg  25 mg Oral Q1400 Akintayo, Mojeed, MD   25 mg at 04/30/18 1519  . sodium chloride 0.9 % bolus 500 mL  500 mL Intravenous Once Varney Biles, MD   Stopped at 04/27/18 0524  . tamsulosin (FLOMAX) capsule 0.4 mg  0.4 mg Oral q1800 Lacretia Leigh, MD   0.4 mg at 04/29/18 2311  . thiamine (VITAMIN B-1) tablet 100 mg  100 mg Oral Daily Virgel Manifold, MD   100 mg at 04/30/18 1027   Current Outpatient Medications  Medication Sig Dispense Refill  .  atorvastatin (LIPITOR) 20 MG tablet Take 20 mg by mouth at bedtime.    . Calcium Carbonate-Vit D-Min (CALTRATE 600+D PLUS MINERALS) 600-800 MG-UNIT CHEW Chew 1 tablet by mouth daily.    . carvedilol (COREG) 12.5 MG tablet Take 12.5 mg by mouth 2 (two) times daily with a meal.    . cloNIDine (CATAPRES) 0.1 MG tablet Take 0.1 mg by mouth 2 (two) times daily.    . divalproex (DEPAKOTE SPRINKLE) 125 MG capsule Take 750 mg by mouth 2 (two) times daily.    . folic acid (FOLVITE) 1 MG tablet Take 1 mg by mouth daily.    . Lacosamide 100 MG TABS Take 100 mg by mouth 2 (two) times daily.    Marland Kitchen levothyroxine (SYNTHROID, LEVOTHROID) 50 MCG tablet Take 50 mcg by mouth daily before breakfast.    . LORazepam (ATIVAN) 0.5 MG tablet Take 0.5 mg by mouth every 6 (six) hours as needed for anxiety.    Marland Kitchen LORazepam (ATIVAN) 1 MG tablet Take 1 mg by mouth at bedtime.    Marland Kitchen OLANZapine (ZYPREXA) 10 MG tablet Take 1 tablet (10 mg total) by mouth daily at 8 pm. 30 tablet 3  . QUEtiapine (SEROQUEL) 100 MG tablet Take 100 mg by mouth at bedtime.    Marland Kitchen QUEtiapine (SEROQUEL) 25 MG tablet Take 12.5 mg by mouth daily at 2 PM.    . tamsulosin (FLOMAX) 0.4 MG CAPS capsule Take 0.4 mg by mouth daily.    Marland Kitchen thiamine (VITAMIN B-1) 100 MG tablet Take 100 mg by mouth daily.    . Vitamin D, Ergocalciferol, (DRISDOL) 1.25 MG (50000 UT) CAPS capsule Take 50,000 Units by mouth every Friday.    . feeding supplement, ENSURE ENLIVE, (ENSURE ENLIVE) LIQD Take 237 mLs by mouth 2 (two) times daily between meals. (Patient not taking: Reported on 04/27/2018) 237 mL 12  . haloperidol (HALDOL) 2 MG tablet Take 1 tablet (2 mg total) by mouth 2 (two) times daily as needed for agitation. (Patient not taking: Reported on 04/27/2018) 10 tablet 0  . insulin aspart (NOVOLOG) 100 UNIT/ML injection Inject 3 Units into the skin 3 (three) times daily with meals. (Patient not taking: Reported on 04/27/2018) 10 mL 11  . insulin glargine (LANTUS) 100 UNIT/ML  injection Inject 0.15 mLs (15 Units total) into the skin 2 (two) times daily. (Patient not taking: Reported on 04/27/2018) 10 mL 11  . predniSONE (DELTASONE) 10 MG tablet Take 1 tablet (10 mg total) by mouth daily with breakfast. (Patient not taking: Reported on 04/27/2018) 30 tablet  1     Discharge Medications: Please see discharge summary for a list of discharge medications.  Relevant Imaging Results:  Relevant Lab Results:   Additional Information [id number]  [name], LCSW

## 2018-04-30 NOTE — ED Notes (Addendum)
Called social work about daughter coming to get him, Building services engineer is going to contact daughter and get back to this nurse if she is or is not coming to get patient. Patient has placement starting tomorrow

## 2018-04-30 NOTE — ED Notes (Signed)
Pt provided with lunch tray at this time.

## 2018-04-30 NOTE — ED Notes (Signed)
Per Ramond Marrow SW: Pts daughter will be here after 2130 to pick pt up for DC. Resources are in pts chart to be given to daughter.

## 2018-05-01 DIAGNOSIS — F0391 Unspecified dementia with behavioral disturbance: Secondary | ICD-10-CM | POA: Diagnosis not present

## 2018-05-01 LAB — CBG MONITORING, ED
Glucose-Capillary: 96 mg/dL (ref 70–99)
Glucose-Capillary: 88 mg/dL (ref 70–99)

## 2018-05-01 NOTE — ED Notes (Addendum)
DISCHARGED WITH DAUGHTER ([name] Zakrzewski [phone])

## 2018-05-01 NOTE — ED Notes (Addendum)
ED Provider at bedside. DAUGHTER PRESENT. ALL QUESTIONS ANSWERED. CONTINUE WITH DISCHARGE

## 2018-05-01 NOTE — ED Provider Notes (Signed)
Patient's family here to take him home.  Patient was discharged 24 hours ago.  Family is concerned about bilateral leg swelling.  I reevaluated him he does have some pitting edema to both legs.  Lungs were clear no rales oxygen saturations 100% on room air.  Patient in no acute distress.  Patient's chest x-ray showed no signs of any pulmonary edema.  Patient's renal function was without any significant renal insufficiency.  Patient stable for discharge home and follow-up with his primary care doctors for further evaluation of the leg swelling.  Legs are swollen bilaterally and equally.  No concerns for any acute problem.   Fredia Sorrow, MD 05/01/18 [phone]

## 2018-05-01 NOTE — ED Notes (Signed)
MACKENZIE SPOKE WITH DAUGHTER AT DISCHARGE

## 2018-05-01 NOTE — Clinical Social Work Note (Signed)
Clinical Social Work Assessment  Patient Details  Name: Andrew Burton MRN: [id number] Date of Birth: 1946/04/23  Date of referral:  05/01/18               Reason for consult:  Discharge Planning                Permission sought to share information with:    Permission granted to share information::     Name::        Agency::     Relationship::     Contact Information:     Housing/Transportation Living arrangements for the past 2 months:  [place] of Information:  Adult Children Patient Interpreter Needed:  None Criminal Activity/Legal Involvement Pertinent to Current Situation/Hospitalization:  No - Comment as needed Significant Relationships:  Adult Children Lives with:  Adult Children Do you feel safe going back to the place where you live?    Need for family participation in patient care:     Care giving concerns:  CSW consulted for assistance with placement.    Social Worker assessment / plan:  CSW aware patient has been psychiatrically and medically cleared for discharge. Per notes, patient was living in [place] but his daughter brought him down to [place] as patient could not live alone. CSW aware patient was recently discharged from [facility]. Per patient's daughter, patient has been living with them for a couple of weeks but she can no longer support him at home as she has children in the home and works full-time. CSW informed patient's daughter we do not hold people in the ED for placement but could assist in providing daughter with a list of Memory Care facilities in the area and refer her to a community advisor who can assist family from the community.   Employment status:  Retired Nurse, adult PT Recommendations:    Information / Referral to community resources:     Patient/Family's Response to care:  Patient's daughter expressed concerns with patient being discharged as she has kids in the home and she  works full-time. Per patient's daughter she is trying to find a place for patient to go but expressed understanding that patient could not stay in the ED.  Patient/Family's Understanding of and Emotional Response to Diagnosis, Current Treatment, and Prognosis:  Patient to be picked up by patient's daughter. Per patient's daughter, she is looking into facilities in [place].  Emotional Assessment Appearance:  Appears stated age Attitude/Demeanor/Rapport:    Affect (typically observed):    Orientation:  Oriented to Self Alcohol / Substance use:    Psych involvement (Current and /or in the community):  Yes (Comment)(Patient has been psychiatrically cleared as of 12/3)  Discharge Needs  Concerns to be addressed:  Discharge Planning Concerns Readmission within the last 30 days:  Yes Current discharge risk:  Other Barriers to Discharge:      Ollen Barges, [place] 05/01/2018, 12:59 PM

## 2018-05-01 NOTE — Progress Notes (Addendum)
CSW received handoff from 2nd shift CSW. Per notes, patient has been accepted to [facility] and that daughter will be here to transport patient to facility at 2pm. CSW has followed up with Phil Dopp in admissions at [facility] who states they are reviewing paperwork now and will follow up with the daughter regarding paperwork. Per [facility], she would call CSW back once she has all the information.  9:42am- CSW received phone call from [facility] requesting [place] fax over information from patient's current visit. CSW has faxed documents to [facility] at [phone]. CSW to await return call.   11:44am- CSW received phone call from [facility] who states facility will not be accepting patient due to patient's behaviors. Per [facility], she offered patient's daughter other resources. CSW attempted to follow up with patient's daughter regarding pick up time. CSW has left voicemail for return call.  12:04pm- CSW received return call from patient's daughter, [name]. Per [name], she was on the way to pick up the patient. [name] informed CSW that she has been talking to a facility in [place] that may be able to accept patient. [name] requested that CSW reach out to the Director of Admissions at [facility] and [employer], [place], as they were requesting information on patient. CSW has left voicemail for return call.   1:23pm- CSW received return call from [facility] who requested patient fax over patient's information and sent over documents requesting CSW to fill out. CSW has faxed over necessary documents but can not fill out forms as they are [place] specific and CSW has not been trained on how to complete them. CSW has explained this to daughter who expressed understanding but sounded visibly upset. Per daughter, she would reach out to [facility] to see how she can get those forms completed. CSW to leave handoff for 2nd shift CSW to follow up with patient's daughter regarding pick up  time.  Ollen Barges, [place] Work Department  [employer]  [phone]

## 2019-12-27 IMAGING — DX DG CHEST 1V PORT
1 series · 1 of 1 positions shown · non-contrast
Comparison: None.

CLINICAL DATA: Psychiatric clearance.

EXAM:
PORTABLE CHEST 1 VIEW

[chest ap]
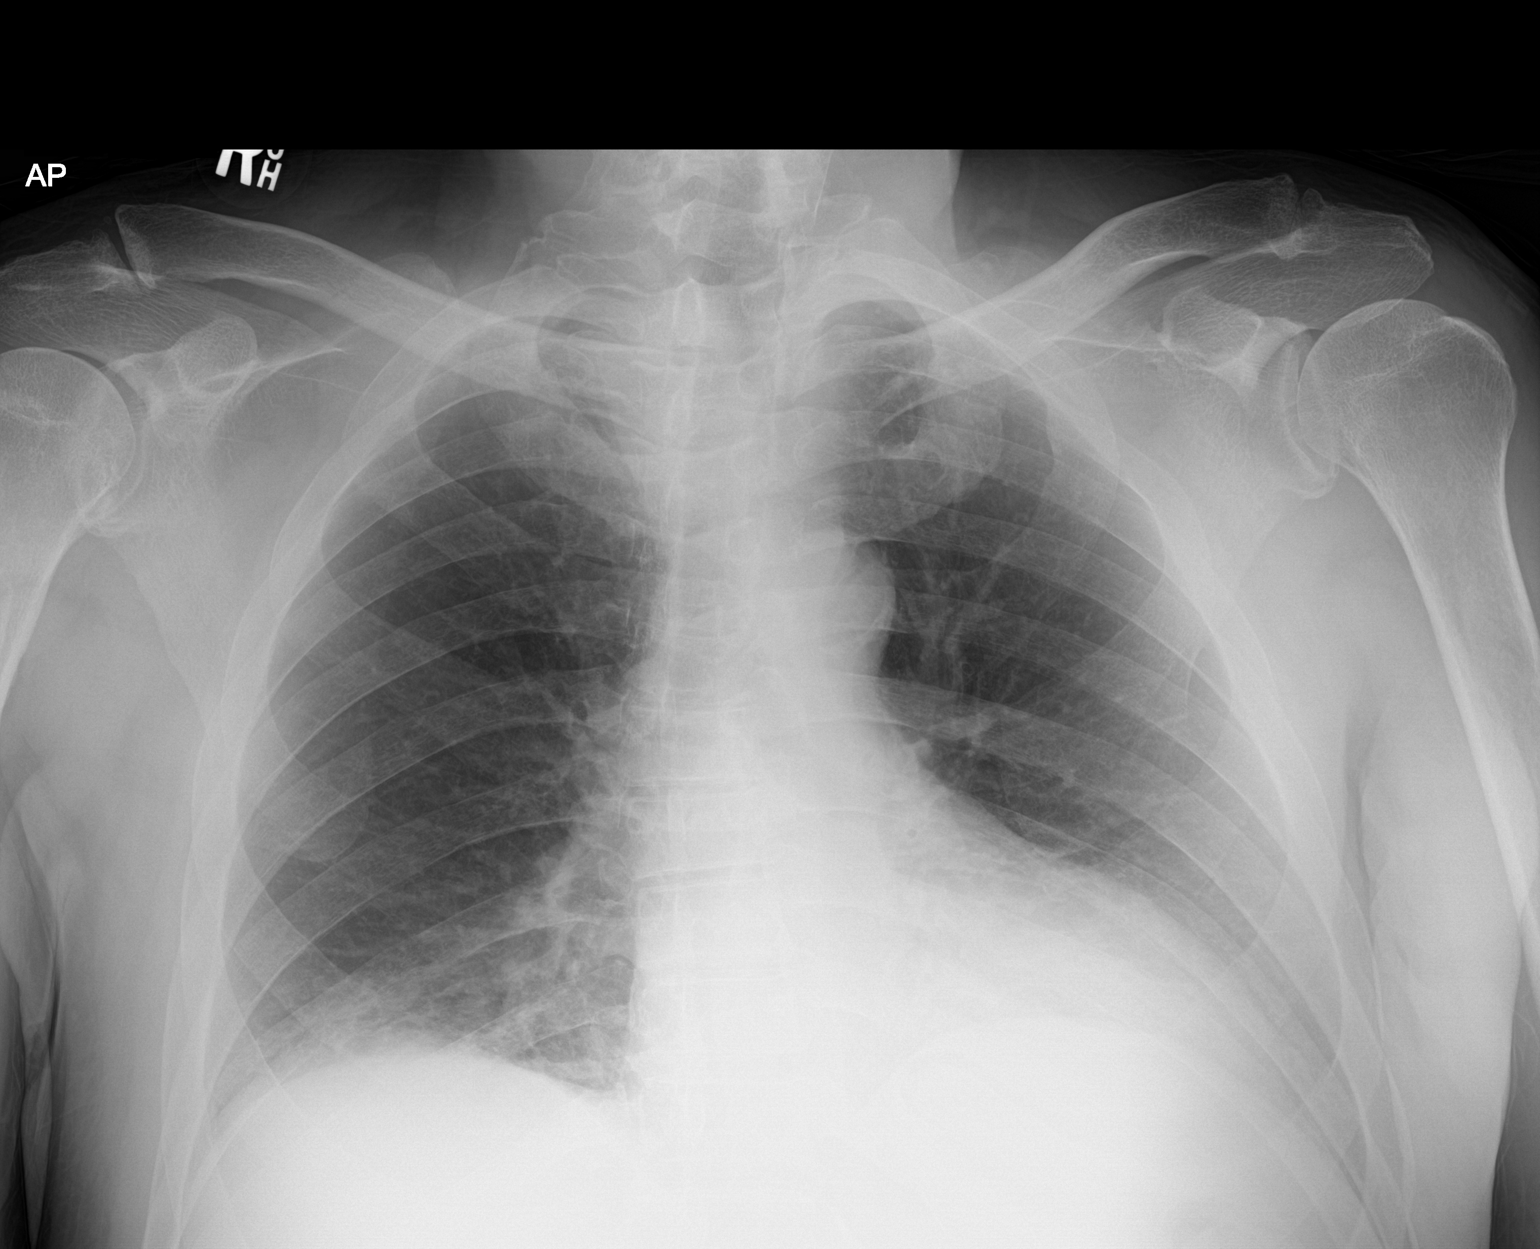

[1 of 1 positions shown; findings below may reference images not displayed]

FINDINGS: Low lung volumes with bibasilar atelectasis, right greater than
left.The cardiomediastinal contours are normal. Pulmonary
vasculature is normal. No consolidation, pleural effusion, or
pneumothorax. No acute osseous abnormalities are seen.
IMPRESSION: Low lung volumes with bibasilar atelectasis.
